# Patient Record
Sex: Male | Born: 1935
Health system: Southern US, Community
[De-identification: ages and names within clinical notes are randomized; demographics above are authoritative.]

## PROBLEM LIST (undated history)

## (undated) DIAGNOSIS — T7840XA Allergy, unspecified, initial encounter: Secondary | ICD-10-CM

## (undated) DIAGNOSIS — I509 Heart failure, unspecified: Secondary | ICD-10-CM

## (undated) DIAGNOSIS — E119 Type 2 diabetes mellitus without complications: Secondary | ICD-10-CM

## (undated) HISTORY — DX: Allergy, unspecified, initial encounter: T78.40XA

## (undated) HISTORY — DX: Heart failure, unspecified: I50.9

## (undated) HISTORY — DX: Type 2 diabetes mellitus without complications: E11.9

---

## 2013-04-17 DIAGNOSIS — E119 Type 2 diabetes mellitus without complications: Secondary | ICD-10-CM | POA: Diagnosis not present

## 2013-04-17 DIAGNOSIS — I10 Essential (primary) hypertension: Secondary | ICD-10-CM | POA: Diagnosis not present

## 2013-04-17 DIAGNOSIS — N429 Disorder of prostate, unspecified: Secondary | ICD-10-CM | POA: Diagnosis not present

## 2013-04-17 DIAGNOSIS — E559 Vitamin D deficiency, unspecified: Secondary | ICD-10-CM | POA: Diagnosis not present

## 2013-04-17 DIAGNOSIS — I25812 Atherosclerosis of bypass graft of coronary artery of transplanted heart without angina pectoris: Secondary | ICD-10-CM | POA: Diagnosis not present

## 2013-04-17 DIAGNOSIS — E782 Mixed hyperlipidemia: Secondary | ICD-10-CM | POA: Diagnosis not present

## 2013-04-17 DIAGNOSIS — D518 Other vitamin B12 deficiency anemias: Secondary | ICD-10-CM | POA: Diagnosis not present

## 2013-04-28 DIAGNOSIS — K5900 Constipation, unspecified: Secondary | ICD-10-CM | POA: Diagnosis not present

## 2013-05-19 DIAGNOSIS — E039 Hypothyroidism, unspecified: Secondary | ICD-10-CM | POA: Diagnosis not present

## 2013-05-19 DIAGNOSIS — E119 Type 2 diabetes mellitus without complications: Secondary | ICD-10-CM | POA: Diagnosis not present

## 2013-07-17 DIAGNOSIS — I25812 Atherosclerosis of bypass graft of coronary artery of transplanted heart without angina pectoris: Secondary | ICD-10-CM | POA: Diagnosis not present

## 2013-07-17 DIAGNOSIS — E119 Type 2 diabetes mellitus without complications: Secondary | ICD-10-CM | POA: Diagnosis not present

## 2013-07-17 DIAGNOSIS — I10 Essential (primary) hypertension: Secondary | ICD-10-CM | POA: Diagnosis not present

## 2013-07-17 DIAGNOSIS — E782 Mixed hyperlipidemia: Secondary | ICD-10-CM | POA: Diagnosis not present

## 2013-07-20 DIAGNOSIS — R059 Cough, unspecified: Secondary | ICD-10-CM | POA: Diagnosis not present

## 2013-07-20 DIAGNOSIS — R05 Cough: Secondary | ICD-10-CM | POA: Diagnosis not present

## 2013-07-27 DIAGNOSIS — H35379 Puckering of macula, unspecified eye: Secondary | ICD-10-CM | POA: Diagnosis not present

## 2013-07-28 DIAGNOSIS — R799 Abnormal finding of blood chemistry, unspecified: Secondary | ICD-10-CM | POA: Diagnosis not present

## 2013-07-28 DIAGNOSIS — R6889 Other general symptoms and signs: Secondary | ICD-10-CM | POA: Diagnosis not present

## 2013-07-29 DIAGNOSIS — I2510 Atherosclerotic heart disease of native coronary artery without angina pectoris: Secondary | ICD-10-CM | POA: Diagnosis not present

## 2013-07-29 DIAGNOSIS — R9431 Abnormal electrocardiogram [ECG] [EKG]: Secondary | ICD-10-CM | POA: Diagnosis not present

## 2013-07-29 DIAGNOSIS — I10 Essential (primary) hypertension: Secondary | ICD-10-CM | POA: Diagnosis not present

## 2013-07-29 DIAGNOSIS — E782 Mixed hyperlipidemia: Secondary | ICD-10-CM | POA: Diagnosis not present

## 2013-08-17 DIAGNOSIS — E039 Hypothyroidism, unspecified: Secondary | ICD-10-CM | POA: Diagnosis not present

## 2013-08-17 DIAGNOSIS — E119 Type 2 diabetes mellitus without complications: Secondary | ICD-10-CM | POA: Diagnosis not present

## 2013-08-17 DIAGNOSIS — R030 Elevated blood-pressure reading, without diagnosis of hypertension: Secondary | ICD-10-CM | POA: Diagnosis not present

## 2013-08-20 DIAGNOSIS — H259 Unspecified age-related cataract: Secondary | ICD-10-CM | POA: Diagnosis not present

## 2013-08-20 DIAGNOSIS — H43819 Vitreous degeneration, unspecified eye: Secondary | ICD-10-CM | POA: Diagnosis not present

## 2013-08-20 DIAGNOSIS — H35379 Puckering of macula, unspecified eye: Secondary | ICD-10-CM | POA: Diagnosis not present

## 2013-09-03 DIAGNOSIS — H2510 Age-related nuclear cataract, unspecified eye: Secondary | ICD-10-CM | POA: Diagnosis not present

## 2013-09-22 DIAGNOSIS — E039 Hypothyroidism, unspecified: Secondary | ICD-10-CM | POA: Diagnosis not present

## 2013-09-22 DIAGNOSIS — I25812 Atherosclerosis of bypass graft of coronary artery of transplanted heart without angina pectoris: Secondary | ICD-10-CM | POA: Diagnosis not present

## 2013-09-22 DIAGNOSIS — E782 Mixed hyperlipidemia: Secondary | ICD-10-CM | POA: Diagnosis not present

## 2013-10-05 DIAGNOSIS — Z01818 Encounter for other preprocedural examination: Secondary | ICD-10-CM | POA: Diagnosis not present

## 2013-10-05 DIAGNOSIS — H269 Unspecified cataract: Secondary | ICD-10-CM | POA: Diagnosis not present

## 2013-10-05 DIAGNOSIS — H2510 Age-related nuclear cataract, unspecified eye: Secondary | ICD-10-CM | POA: Diagnosis not present

## 2013-10-05 DIAGNOSIS — I2510 Atherosclerotic heart disease of native coronary artery without angina pectoris: Secondary | ICD-10-CM | POA: Diagnosis not present

## 2013-10-07 DIAGNOSIS — H35349 Macular cyst, hole, or pseudohole, unspecified eye: Secondary | ICD-10-CM | POA: Diagnosis not present

## 2013-10-07 DIAGNOSIS — H2510 Age-related nuclear cataract, unspecified eye: Secondary | ICD-10-CM | POA: Diagnosis not present

## 2013-10-07 DIAGNOSIS — Z9861 Coronary angioplasty status: Secondary | ICD-10-CM | POA: Diagnosis not present

## 2013-10-07 DIAGNOSIS — I2510 Atherosclerotic heart disease of native coronary artery without angina pectoris: Secondary | ICD-10-CM | POA: Diagnosis not present

## 2013-10-07 DIAGNOSIS — E119 Type 2 diabetes mellitus without complications: Secondary | ICD-10-CM | POA: Diagnosis not present

## 2013-10-07 DIAGNOSIS — H269 Unspecified cataract: Secondary | ICD-10-CM | POA: Diagnosis not present

## 2013-10-07 DIAGNOSIS — I252 Old myocardial infarction: Secondary | ICD-10-CM | POA: Diagnosis not present

## 2013-10-07 DIAGNOSIS — E7800 Pure hypercholesterolemia, unspecified: Secondary | ICD-10-CM | POA: Diagnosis not present

## 2013-10-07 DIAGNOSIS — E039 Hypothyroidism, unspecified: Secondary | ICD-10-CM | POA: Diagnosis not present

## 2013-10-07 DIAGNOSIS — I739 Peripheral vascular disease, unspecified: Secondary | ICD-10-CM | POA: Diagnosis not present

## 2013-10-07 DIAGNOSIS — Z7982 Long term (current) use of aspirin: Secondary | ICD-10-CM | POA: Diagnosis not present

## 2013-10-07 DIAGNOSIS — H2589 Other age-related cataract: Secondary | ICD-10-CM | POA: Diagnosis not present

## 2013-10-07 DIAGNOSIS — Z951 Presence of aortocoronary bypass graft: Secondary | ICD-10-CM | POA: Diagnosis not present

## 2013-10-07 DIAGNOSIS — Z87891 Personal history of nicotine dependence: Secondary | ICD-10-CM | POA: Diagnosis not present

## 2014-02-08 DIAGNOSIS — H35372 Puckering of macula, left eye: Secondary | ICD-10-CM | POA: Diagnosis not present

## 2014-02-08 DIAGNOSIS — E782 Mixed hyperlipidemia: Secondary | ICD-10-CM | POA: Diagnosis not present

## 2014-02-08 DIAGNOSIS — E119 Type 2 diabetes mellitus without complications: Secondary | ICD-10-CM | POA: Diagnosis not present

## 2014-02-08 DIAGNOSIS — I2510 Atherosclerotic heart disease of native coronary artery without angina pectoris: Secondary | ICD-10-CM | POA: Diagnosis not present

## 2014-02-08 DIAGNOSIS — N400 Enlarged prostate without lower urinary tract symptoms: Secondary | ICD-10-CM | POA: Diagnosis not present

## 2014-02-08 DIAGNOSIS — I10 Essential (primary) hypertension: Secondary | ICD-10-CM | POA: Diagnosis not present

## 2014-02-08 DIAGNOSIS — Z23 Encounter for immunization: Secondary | ICD-10-CM | POA: Diagnosis not present

## 2014-02-08 DIAGNOSIS — E559 Vitamin D deficiency, unspecified: Secondary | ICD-10-CM | POA: Diagnosis not present

## 2014-04-26 DIAGNOSIS — I10 Essential (primary) hypertension: Secondary | ICD-10-CM | POA: Diagnosis not present

## 2014-04-26 DIAGNOSIS — E878 Other disorders of electrolyte and fluid balance, not elsewhere classified: Secondary | ICD-10-CM | POA: Diagnosis not present

## 2014-04-26 DIAGNOSIS — E119 Type 2 diabetes mellitus without complications: Secondary | ICD-10-CM | POA: Diagnosis not present

## 2014-04-26 DIAGNOSIS — E780 Pure hypercholesterolemia: Secondary | ICD-10-CM | POA: Diagnosis not present

## 2014-05-06 DIAGNOSIS — E780 Pure hypercholesterolemia: Secondary | ICD-10-CM | POA: Diagnosis not present

## 2014-05-06 DIAGNOSIS — I10 Essential (primary) hypertension: Secondary | ICD-10-CM | POA: Diagnosis not present

## 2014-05-06 DIAGNOSIS — I252 Old myocardial infarction: Secondary | ICD-10-CM | POA: Diagnosis not present

## 2014-05-06 DIAGNOSIS — I2510 Atherosclerotic heart disease of native coronary artery without angina pectoris: Secondary | ICD-10-CM | POA: Diagnosis not present

## 2014-07-19 DIAGNOSIS — E1165 Type 2 diabetes mellitus with hyperglycemia: Secondary | ICD-10-CM | POA: Diagnosis not present

## 2014-07-26 DIAGNOSIS — E119 Type 2 diabetes mellitus without complications: Secondary | ICD-10-CM | POA: Diagnosis not present

## 2014-07-26 DIAGNOSIS — E780 Pure hypercholesterolemia: Secondary | ICD-10-CM | POA: Diagnosis not present

## 2014-07-26 DIAGNOSIS — Z955 Presence of coronary angioplasty implant and graft: Secondary | ICD-10-CM | POA: Diagnosis not present

## 2014-08-10 DIAGNOSIS — I2510 Atherosclerotic heart disease of native coronary artery without angina pectoris: Secondary | ICD-10-CM | POA: Diagnosis not present

## 2014-08-10 DIAGNOSIS — E785 Hyperlipidemia, unspecified: Secondary | ICD-10-CM | POA: Diagnosis not present

## 2014-08-10 DIAGNOSIS — I252 Old myocardial infarction: Secondary | ICD-10-CM | POA: Diagnosis not present

## 2014-08-25 DIAGNOSIS — I252 Old myocardial infarction: Secondary | ICD-10-CM | POA: Diagnosis not present

## 2014-08-25 DIAGNOSIS — R0789 Other chest pain: Secondary | ICD-10-CM | POA: Diagnosis not present

## 2014-08-30 DIAGNOSIS — I10 Essential (primary) hypertension: Secondary | ICD-10-CM | POA: Insufficient documentation

## 2014-08-30 DIAGNOSIS — I255 Ischemic cardiomyopathy: Secondary | ICD-10-CM | POA: Diagnosis not present

## 2014-08-30 DIAGNOSIS — I2510 Atherosclerotic heart disease of native coronary artery without angina pectoris: Secondary | ICD-10-CM | POA: Diagnosis not present

## 2014-08-30 DIAGNOSIS — E119 Type 2 diabetes mellitus without complications: Secondary | ICD-10-CM | POA: Insufficient documentation

## 2014-09-20 DIAGNOSIS — I255 Ischemic cardiomyopathy: Secondary | ICD-10-CM | POA: Diagnosis not present

## 2014-09-20 DIAGNOSIS — Z131 Encounter for screening for diabetes mellitus: Secondary | ICD-10-CM | POA: Diagnosis not present

## 2014-09-20 DIAGNOSIS — I10 Essential (primary) hypertension: Secondary | ICD-10-CM | POA: Diagnosis not present

## 2014-09-30 DIAGNOSIS — N63 Unspecified lump in breast: Secondary | ICD-10-CM | POA: Diagnosis not present

## 2014-09-30 DIAGNOSIS — R918 Other nonspecific abnormal finding of lung field: Secondary | ICD-10-CM | POA: Diagnosis not present

## 2014-09-30 DIAGNOSIS — M47814 Spondylosis without myelopathy or radiculopathy, thoracic region: Secondary | ICD-10-CM | POA: Diagnosis not present

## 2014-09-30 DIAGNOSIS — Z01810 Encounter for preprocedural cardiovascular examination: Secondary | ICD-10-CM | POA: Diagnosis not present

## 2014-09-30 DIAGNOSIS — Z01818 Encounter for other preprocedural examination: Secondary | ICD-10-CM | POA: Diagnosis not present

## 2014-09-30 DIAGNOSIS — Z131 Encounter for screening for diabetes mellitus: Secondary | ICD-10-CM | POA: Diagnosis not present

## 2014-09-30 DIAGNOSIS — I10 Essential (primary) hypertension: Secondary | ICD-10-CM | POA: Diagnosis not present

## 2014-09-30 DIAGNOSIS — I255 Ischemic cardiomyopathy: Secondary | ICD-10-CM | POA: Diagnosis not present

## 2014-10-07 DIAGNOSIS — Z4502 Encounter for adjustment and management of automatic implantable cardiac defibrillator: Secondary | ICD-10-CM | POA: Diagnosis not present

## 2014-10-07 DIAGNOSIS — M47814 Spondylosis without myelopathy or radiculopathy, thoracic region: Secondary | ICD-10-CM | POA: Diagnosis not present

## 2014-10-07 DIAGNOSIS — I517 Cardiomegaly: Secondary | ICD-10-CM | POA: Diagnosis not present

## 2014-10-07 DIAGNOSIS — I10 Essential (primary) hypertension: Secondary | ICD-10-CM | POA: Diagnosis not present

## 2014-10-07 DIAGNOSIS — Z79899 Other long term (current) drug therapy: Secondary | ICD-10-CM | POA: Diagnosis not present

## 2014-10-07 DIAGNOSIS — E119 Type 2 diabetes mellitus without complications: Secondary | ICD-10-CM | POA: Diagnosis not present

## 2014-10-07 DIAGNOSIS — I255 Ischemic cardiomyopathy: Secondary | ICD-10-CM | POA: Diagnosis not present

## 2014-10-08 DIAGNOSIS — M47814 Spondylosis without myelopathy or radiculopathy, thoracic region: Secondary | ICD-10-CM | POA: Diagnosis not present

## 2014-10-08 DIAGNOSIS — Z79899 Other long term (current) drug therapy: Secondary | ICD-10-CM | POA: Diagnosis not present

## 2014-10-08 DIAGNOSIS — Z45018 Encounter for adjustment and management of other part of cardiac pacemaker: Secondary | ICD-10-CM | POA: Diagnosis not present

## 2014-10-08 DIAGNOSIS — I10 Essential (primary) hypertension: Secondary | ICD-10-CM | POA: Diagnosis not present

## 2014-10-08 DIAGNOSIS — E119 Type 2 diabetes mellitus without complications: Secondary | ICD-10-CM | POA: Diagnosis not present

## 2014-10-08 DIAGNOSIS — I255 Ischemic cardiomyopathy: Secondary | ICD-10-CM | POA: Diagnosis not present

## 2014-10-18 DIAGNOSIS — I255 Ischemic cardiomyopathy: Secondary | ICD-10-CM | POA: Diagnosis not present

## 2014-10-18 DIAGNOSIS — Z4502 Encounter for adjustment and management of automatic implantable cardiac defibrillator: Secondary | ICD-10-CM | POA: Diagnosis not present

## 2014-10-28 DIAGNOSIS — Z955 Presence of coronary angioplasty implant and graft: Secondary | ICD-10-CM | POA: Diagnosis not present

## 2014-10-28 DIAGNOSIS — I252 Old myocardial infarction: Secondary | ICD-10-CM | POA: Diagnosis not present

## 2014-10-28 DIAGNOSIS — I2510 Atherosclerotic heart disease of native coronary artery without angina pectoris: Secondary | ICD-10-CM | POA: Diagnosis not present

## 2014-10-28 DIAGNOSIS — E785 Hyperlipidemia, unspecified: Secondary | ICD-10-CM | POA: Diagnosis not present

## 2014-12-28 DIAGNOSIS — H2513 Age-related nuclear cataract, bilateral: Secondary | ICD-10-CM | POA: Diagnosis not present

## 2014-12-29 DIAGNOSIS — R600 Localized edema: Secondary | ICD-10-CM | POA: Diagnosis not present

## 2014-12-29 DIAGNOSIS — I2510 Atherosclerotic heart disease of native coronary artery without angina pectoris: Secondary | ICD-10-CM | POA: Diagnosis not present

## 2014-12-29 DIAGNOSIS — I10 Essential (primary) hypertension: Secondary | ICD-10-CM | POA: Diagnosis not present

## 2014-12-29 DIAGNOSIS — E119 Type 2 diabetes mellitus without complications: Secondary | ICD-10-CM | POA: Diagnosis not present

## 2014-12-29 DIAGNOSIS — Z131 Encounter for screening for diabetes mellitus: Secondary | ICD-10-CM | POA: Diagnosis not present

## 2014-12-31 DIAGNOSIS — I2510 Atherosclerotic heart disease of native coronary artery without angina pectoris: Secondary | ICD-10-CM | POA: Diagnosis not present

## 2015-01-03 DIAGNOSIS — H2511 Age-related nuclear cataract, right eye: Secondary | ICD-10-CM | POA: Diagnosis not present

## 2015-01-05 DIAGNOSIS — Z79899 Other long term (current) drug therapy: Secondary | ICD-10-CM | POA: Diagnosis not present

## 2015-01-05 DIAGNOSIS — E039 Hypothyroidism, unspecified: Secondary | ICD-10-CM | POA: Diagnosis not present

## 2015-01-05 DIAGNOSIS — Z23 Encounter for immunization: Secondary | ICD-10-CM | POA: Diagnosis not present

## 2015-01-05 DIAGNOSIS — E119 Type 2 diabetes mellitus without complications: Secondary | ICD-10-CM | POA: Diagnosis not present

## 2015-01-11 DIAGNOSIS — I5022 Chronic systolic (congestive) heart failure: Secondary | ICD-10-CM | POA: Diagnosis not present

## 2015-01-11 DIAGNOSIS — I2510 Atherosclerotic heart disease of native coronary artery without angina pectoris: Secondary | ICD-10-CM | POA: Diagnosis not present

## 2015-01-11 DIAGNOSIS — I10 Essential (primary) hypertension: Secondary | ICD-10-CM | POA: Diagnosis not present

## 2015-01-11 DIAGNOSIS — I255 Ischemic cardiomyopathy: Secondary | ICD-10-CM | POA: Diagnosis not present

## 2015-03-14 DIAGNOSIS — Z7984 Long term (current) use of oral hypoglycemic drugs: Secondary | ICD-10-CM | POA: Diagnosis not present

## 2015-03-14 DIAGNOSIS — R079 Chest pain, unspecified: Secondary | ICD-10-CM | POA: Diagnosis not present

## 2015-03-14 DIAGNOSIS — I2510 Atherosclerotic heart disease of native coronary artery without angina pectoris: Secondary | ICD-10-CM | POA: Diagnosis present

## 2015-03-14 DIAGNOSIS — Z7982 Long term (current) use of aspirin: Secondary | ICD-10-CM | POA: Diagnosis not present

## 2015-03-14 DIAGNOSIS — I5023 Acute on chronic systolic (congestive) heart failure: Secondary | ICD-10-CM | POA: Diagnosis present

## 2015-03-14 DIAGNOSIS — Z79899 Other long term (current) drug therapy: Secondary | ICD-10-CM | POA: Diagnosis not present

## 2015-03-14 DIAGNOSIS — E039 Hypothyroidism, unspecified: Secondary | ICD-10-CM | POA: Insufficient documentation

## 2015-03-14 DIAGNOSIS — Z955 Presence of coronary angioplasty implant and graft: Secondary | ICD-10-CM | POA: Diagnosis not present

## 2015-03-14 DIAGNOSIS — Z961 Presence of intraocular lens: Secondary | ICD-10-CM | POA: Diagnosis present

## 2015-03-14 DIAGNOSIS — I10 Essential (primary) hypertension: Secondary | ICD-10-CM | POA: Diagnosis not present

## 2015-03-14 DIAGNOSIS — E785 Hyperlipidemia, unspecified: Secondary | ICD-10-CM | POA: Diagnosis present

## 2015-03-14 DIAGNOSIS — I252 Old myocardial infarction: Secondary | ICD-10-CM | POA: Diagnosis not present

## 2015-03-14 DIAGNOSIS — I255 Ischemic cardiomyopathy: Secondary | ICD-10-CM | POA: Diagnosis present

## 2015-03-14 DIAGNOSIS — I110 Hypertensive heart disease with heart failure: Secondary | ICD-10-CM | POA: Diagnosis present

## 2015-03-14 DIAGNOSIS — I5021 Acute systolic (congestive) heart failure: Secondary | ICD-10-CM | POA: Diagnosis not present

## 2015-03-14 DIAGNOSIS — R0789 Other chest pain: Secondary | ICD-10-CM | POA: Insufficient documentation

## 2015-03-14 DIAGNOSIS — Z87891 Personal history of nicotine dependence: Secondary | ICD-10-CM | POA: Diagnosis not present

## 2015-03-14 DIAGNOSIS — Z9842 Cataract extraction status, left eye: Secondary | ICD-10-CM | POA: Diagnosis not present

## 2015-03-14 DIAGNOSIS — I501 Left ventricular failure: Secondary | ICD-10-CM | POA: Diagnosis not present

## 2015-03-14 DIAGNOSIS — E875 Hyperkalemia: Secondary | ICD-10-CM | POA: Diagnosis not present

## 2015-03-14 DIAGNOSIS — Z95810 Presence of automatic (implantable) cardiac defibrillator: Secondary | ICD-10-CM | POA: Diagnosis not present

## 2015-03-14 DIAGNOSIS — E11649 Type 2 diabetes mellitus with hypoglycemia without coma: Secondary | ICD-10-CM | POA: Diagnosis not present

## 2015-03-14 DIAGNOSIS — R0602 Shortness of breath: Secondary | ICD-10-CM | POA: Diagnosis not present

## 2015-03-14 DIAGNOSIS — E871 Hypo-osmolality and hyponatremia: Secondary | ICD-10-CM | POA: Diagnosis present

## 2015-03-14 DIAGNOSIS — I509 Heart failure, unspecified: Secondary | ICD-10-CM | POA: Diagnosis not present

## 2015-03-29 DIAGNOSIS — I255 Ischemic cardiomyopathy: Secondary | ICD-10-CM | POA: Diagnosis not present

## 2015-03-29 DIAGNOSIS — I5023 Acute on chronic systolic (congestive) heart failure: Secondary | ICD-10-CM | POA: Diagnosis not present

## 2015-03-29 DIAGNOSIS — I2510 Atherosclerotic heart disease of native coronary artery without angina pectoris: Secondary | ICD-10-CM | POA: Diagnosis not present

## 2015-03-29 DIAGNOSIS — R0609 Other forms of dyspnea: Secondary | ICD-10-CM | POA: Diagnosis not present

## 2015-04-15 DIAGNOSIS — I447 Left bundle-branch block, unspecified: Secondary | ICD-10-CM | POA: Diagnosis not present

## 2015-04-15 DIAGNOSIS — I10 Essential (primary) hypertension: Secondary | ICD-10-CM | POA: Diagnosis not present

## 2015-04-15 DIAGNOSIS — I255 Ischemic cardiomyopathy: Secondary | ICD-10-CM | POA: Diagnosis not present

## 2015-04-15 DIAGNOSIS — Z955 Presence of coronary angioplasty implant and graft: Secondary | ICD-10-CM | POA: Diagnosis not present

## 2015-04-15 DIAGNOSIS — I2510 Atherosclerotic heart disease of native coronary artery without angina pectoris: Secondary | ICD-10-CM | POA: Diagnosis not present

## 2015-04-15 DIAGNOSIS — I5022 Chronic systolic (congestive) heart failure: Secondary | ICD-10-CM | POA: Diagnosis not present

## 2015-04-21 DIAGNOSIS — I2510 Atherosclerotic heart disease of native coronary artery without angina pectoris: Secondary | ICD-10-CM | POA: Diagnosis not present

## 2015-04-21 DIAGNOSIS — I255 Ischemic cardiomyopathy: Secondary | ICD-10-CM | POA: Diagnosis not present

## 2015-04-22 DIAGNOSIS — H6123 Impacted cerumen, bilateral: Secondary | ICD-10-CM | POA: Diagnosis not present

## 2015-05-13 DIAGNOSIS — I5022 Chronic systolic (congestive) heart failure: Secondary | ICD-10-CM | POA: Diagnosis not present

## 2015-05-13 DIAGNOSIS — I2510 Atherosclerotic heart disease of native coronary artery without angina pectoris: Secondary | ICD-10-CM | POA: Diagnosis not present

## 2015-05-13 DIAGNOSIS — I10 Essential (primary) hypertension: Secondary | ICD-10-CM | POA: Diagnosis not present

## 2015-05-13 DIAGNOSIS — I255 Ischemic cardiomyopathy: Secondary | ICD-10-CM | POA: Diagnosis not present

## 2015-05-13 DIAGNOSIS — Z01818 Encounter for other preprocedural examination: Secondary | ICD-10-CM | POA: Diagnosis not present

## 2015-05-13 DIAGNOSIS — I447 Left bundle-branch block, unspecified: Secondary | ICD-10-CM | POA: Diagnosis not present

## 2015-05-13 DIAGNOSIS — Z955 Presence of coronary angioplasty implant and graft: Secondary | ICD-10-CM | POA: Diagnosis not present

## 2015-05-16 DIAGNOSIS — I420 Dilated cardiomyopathy: Secondary | ICD-10-CM | POA: Diagnosis not present

## 2015-05-16 DIAGNOSIS — Z950 Presence of cardiac pacemaker: Secondary | ICD-10-CM | POA: Diagnosis not present

## 2015-05-16 DIAGNOSIS — I501 Left ventricular failure: Secondary | ICD-10-CM | POA: Diagnosis not present

## 2015-05-16 DIAGNOSIS — I2510 Atherosclerotic heart disease of native coronary artery without angina pectoris: Secondary | ICD-10-CM | POA: Diagnosis not present

## 2015-05-16 DIAGNOSIS — Z79899 Other long term (current) drug therapy: Secondary | ICD-10-CM | POA: Diagnosis not present

## 2015-05-16 DIAGNOSIS — I10 Essential (primary) hypertension: Secondary | ICD-10-CM | POA: Diagnosis not present

## 2015-05-16 DIAGNOSIS — I447 Left bundle-branch block, unspecified: Secondary | ICD-10-CM | POA: Diagnosis not present

## 2015-05-16 DIAGNOSIS — I255 Ischemic cardiomyopathy: Secondary | ICD-10-CM | POA: Diagnosis not present

## 2015-05-16 DIAGNOSIS — I5022 Chronic systolic (congestive) heart failure: Secondary | ICD-10-CM | POA: Diagnosis not present

## 2015-05-17 DIAGNOSIS — I255 Ischemic cardiomyopathy: Secondary | ICD-10-CM | POA: Diagnosis not present

## 2015-05-17 DIAGNOSIS — Z95810 Presence of automatic (implantable) cardiac defibrillator: Secondary | ICD-10-CM | POA: Diagnosis not present

## 2015-05-17 DIAGNOSIS — I420 Dilated cardiomyopathy: Secondary | ICD-10-CM | POA: Diagnosis not present

## 2015-05-17 DIAGNOSIS — I501 Left ventricular failure: Secondary | ICD-10-CM | POA: Diagnosis not present

## 2015-05-17 DIAGNOSIS — I10 Essential (primary) hypertension: Secondary | ICD-10-CM | POA: Diagnosis not present

## 2015-05-17 DIAGNOSIS — I509 Heart failure, unspecified: Secondary | ICD-10-CM | POA: Diagnosis not present

## 2015-05-17 DIAGNOSIS — I5022 Chronic systolic (congestive) heart failure: Secondary | ICD-10-CM | POA: Diagnosis not present

## 2015-05-17 DIAGNOSIS — I2510 Atherosclerotic heart disease of native coronary artery without angina pectoris: Secondary | ICD-10-CM | POA: Diagnosis not present

## 2015-05-17 DIAGNOSIS — E119 Type 2 diabetes mellitus without complications: Secondary | ICD-10-CM | POA: Diagnosis not present

## 2015-05-17 DIAGNOSIS — I447 Left bundle-branch block, unspecified: Secondary | ICD-10-CM | POA: Diagnosis not present

## 2015-05-27 DIAGNOSIS — Z4502 Encounter for adjustment and management of automatic implantable cardiac defibrillator: Secondary | ICD-10-CM | POA: Diagnosis not present

## 2015-05-27 DIAGNOSIS — I255 Ischemic cardiomyopathy: Secondary | ICD-10-CM | POA: Diagnosis not present

## 2015-07-07 DIAGNOSIS — E039 Hypothyroidism, unspecified: Secondary | ICD-10-CM | POA: Diagnosis not present

## 2015-07-07 DIAGNOSIS — E119 Type 2 diabetes mellitus without complications: Secondary | ICD-10-CM | POA: Diagnosis not present

## 2015-07-14 DIAGNOSIS — E119 Type 2 diabetes mellitus without complications: Secondary | ICD-10-CM | POA: Diagnosis not present

## 2015-07-14 DIAGNOSIS — Z1389 Encounter for screening for other disorder: Secondary | ICD-10-CM | POA: Diagnosis not present

## 2015-07-14 DIAGNOSIS — Z9181 History of falling: Secondary | ICD-10-CM | POA: Diagnosis not present

## 2015-09-26 DIAGNOSIS — Z95810 Presence of automatic (implantable) cardiac defibrillator: Secondary | ICD-10-CM | POA: Diagnosis not present

## 2015-09-26 DIAGNOSIS — Z4502 Encounter for adjustment and management of automatic implantable cardiac defibrillator: Secondary | ICD-10-CM | POA: Diagnosis not present

## 2015-09-26 DIAGNOSIS — I255 Ischemic cardiomyopathy: Secondary | ICD-10-CM | POA: Diagnosis not present

## 2015-09-26 DIAGNOSIS — I5022 Chronic systolic (congestive) heart failure: Secondary | ICD-10-CM | POA: Diagnosis not present

## 2015-12-28 DIAGNOSIS — Z95810 Presence of automatic (implantable) cardiac defibrillator: Secondary | ICD-10-CM | POA: Diagnosis not present

## 2015-12-28 DIAGNOSIS — I5023 Acute on chronic systolic (congestive) heart failure: Secondary | ICD-10-CM | POA: Diagnosis not present

## 2016-01-24 DIAGNOSIS — H26492 Other secondary cataract, left eye: Secondary | ICD-10-CM | POA: Diagnosis not present

## 2016-01-24 DIAGNOSIS — H35372 Puckering of macula, left eye: Secondary | ICD-10-CM | POA: Diagnosis not present

## 2016-01-24 DIAGNOSIS — E119 Type 2 diabetes mellitus without complications: Secondary | ICD-10-CM | POA: Diagnosis not present

## 2016-01-25 DIAGNOSIS — Z23 Encounter for immunization: Secondary | ICD-10-CM | POA: Diagnosis not present

## 2016-03-28 DIAGNOSIS — I255 Ischemic cardiomyopathy: Secondary | ICD-10-CM | POA: Diagnosis not present

## 2016-03-28 DIAGNOSIS — I420 Dilated cardiomyopathy: Secondary | ICD-10-CM | POA: Diagnosis not present

## 2016-05-09 DIAGNOSIS — H26492 Other secondary cataract, left eye: Secondary | ICD-10-CM | POA: Diagnosis not present

## 2016-05-09 DIAGNOSIS — H35372 Puckering of macula, left eye: Secondary | ICD-10-CM | POA: Diagnosis not present

## 2016-06-27 DIAGNOSIS — I5023 Acute on chronic systolic (congestive) heart failure: Secondary | ICD-10-CM | POA: Diagnosis not present

## 2016-06-27 DIAGNOSIS — Z95810 Presence of automatic (implantable) cardiac defibrillator: Secondary | ICD-10-CM | POA: Diagnosis not present

## 2016-07-20 DIAGNOSIS — I255 Ischemic cardiomyopathy: Secondary | ICD-10-CM | POA: Diagnosis not present

## 2016-07-20 DIAGNOSIS — Z95810 Presence of automatic (implantable) cardiac defibrillator: Secondary | ICD-10-CM | POA: Diagnosis not present

## 2016-07-20 DIAGNOSIS — R079 Chest pain, unspecified: Secondary | ICD-10-CM | POA: Diagnosis not present

## 2016-07-20 DIAGNOSIS — I2510 Atherosclerotic heart disease of native coronary artery without angina pectoris: Secondary | ICD-10-CM | POA: Diagnosis not present

## 2016-07-20 DIAGNOSIS — I420 Dilated cardiomyopathy: Secondary | ICD-10-CM | POA: Diagnosis not present

## 2016-08-07 DIAGNOSIS — I255 Ischemic cardiomyopathy: Secondary | ICD-10-CM | POA: Diagnosis not present

## 2016-08-07 DIAGNOSIS — I420 Dilated cardiomyopathy: Secondary | ICD-10-CM | POA: Diagnosis not present

## 2016-08-07 DIAGNOSIS — R0602 Shortness of breath: Secondary | ICD-10-CM | POA: Diagnosis not present

## 2016-08-07 DIAGNOSIS — I2510 Atherosclerotic heart disease of native coronary artery without angina pectoris: Secondary | ICD-10-CM | POA: Diagnosis not present

## 2016-08-07 DIAGNOSIS — Z9861 Coronary angioplasty status: Secondary | ICD-10-CM | POA: Diagnosis not present

## 2016-08-07 DIAGNOSIS — Z950 Presence of cardiac pacemaker: Secondary | ICD-10-CM | POA: Diagnosis not present

## 2016-08-07 DIAGNOSIS — R079 Chest pain, unspecified: Secondary | ICD-10-CM | POA: Diagnosis not present

## 2016-08-15 DIAGNOSIS — I5022 Chronic systolic (congestive) heart failure: Secondary | ICD-10-CM | POA: Diagnosis not present

## 2016-08-15 DIAGNOSIS — I420 Dilated cardiomyopathy: Secondary | ICD-10-CM | POA: Diagnosis not present

## 2016-08-15 DIAGNOSIS — R0789 Other chest pain: Secondary | ICD-10-CM | POA: Diagnosis not present

## 2016-08-15 DIAGNOSIS — I255 Ischemic cardiomyopathy: Secondary | ICD-10-CM | POA: Diagnosis not present

## 2016-08-15 DIAGNOSIS — I10 Essential (primary) hypertension: Secondary | ICD-10-CM | POA: Diagnosis not present

## 2016-08-15 DIAGNOSIS — I2510 Atherosclerotic heart disease of native coronary artery without angina pectoris: Secondary | ICD-10-CM | POA: Diagnosis not present

## 2016-09-27 DIAGNOSIS — Z95810 Presence of automatic (implantable) cardiac defibrillator: Secondary | ICD-10-CM | POA: Diagnosis not present

## 2016-10-31 DIAGNOSIS — I10 Essential (primary) hypertension: Secondary | ICD-10-CM | POA: Diagnosis not present

## 2016-10-31 DIAGNOSIS — Z79899 Other long term (current) drug therapy: Secondary | ICD-10-CM | POA: Diagnosis not present

## 2016-10-31 DIAGNOSIS — Z0000 Encounter for general adult medical examination without abnormal findings: Secondary | ICD-10-CM | POA: Diagnosis not present

## 2016-10-31 DIAGNOSIS — E789 Disorder of lipoprotein metabolism, unspecified: Secondary | ICD-10-CM | POA: Diagnosis not present

## 2016-10-31 DIAGNOSIS — E039 Hypothyroidism, unspecified: Secondary | ICD-10-CM | POA: Diagnosis not present

## 2016-10-31 DIAGNOSIS — E119 Type 2 diabetes mellitus without complications: Secondary | ICD-10-CM | POA: Diagnosis not present

## 2016-12-26 DIAGNOSIS — Z23 Encounter for immunization: Secondary | ICD-10-CM | POA: Diagnosis not present

## 2016-12-28 DIAGNOSIS — Z95810 Presence of automatic (implantable) cardiac defibrillator: Secondary | ICD-10-CM | POA: Diagnosis not present

## 2016-12-28 DIAGNOSIS — I255 Ischemic cardiomyopathy: Secondary | ICD-10-CM | POA: Diagnosis not present

## 2017-01-09 DIAGNOSIS — H35342 Macular cyst, hole, or pseudohole, left eye: Secondary | ICD-10-CM | POA: Diagnosis not present

## 2017-01-29 DIAGNOSIS — E785 Hyperlipidemia, unspecified: Secondary | ICD-10-CM | POA: Insufficient documentation

## 2017-01-29 DIAGNOSIS — I5022 Chronic systolic (congestive) heart failure: Secondary | ICD-10-CM | POA: Diagnosis not present

## 2017-01-29 DIAGNOSIS — I252 Old myocardial infarction: Secondary | ICD-10-CM | POA: Diagnosis not present

## 2017-01-29 DIAGNOSIS — I2510 Atherosclerotic heart disease of native coronary artery without angina pectoris: Secondary | ICD-10-CM | POA: Insufficient documentation

## 2017-01-29 DIAGNOSIS — I420 Dilated cardiomyopathy: Secondary | ICD-10-CM | POA: Diagnosis not present

## 2017-01-29 DIAGNOSIS — I25810 Atherosclerosis of coronary artery bypass graft(s) without angina pectoris: Secondary | ICD-10-CM | POA: Diagnosis not present

## 2017-01-29 DIAGNOSIS — I110 Hypertensive heart disease with heart failure: Secondary | ICD-10-CM | POA: Diagnosis not present

## 2017-01-29 DIAGNOSIS — E119 Type 2 diabetes mellitus without complications: Secondary | ICD-10-CM | POA: Diagnosis not present

## 2017-01-29 DIAGNOSIS — I255 Ischemic cardiomyopathy: Secondary | ICD-10-CM | POA: Diagnosis not present

## 2017-02-08 DIAGNOSIS — I5022 Chronic systolic (congestive) heart failure: Secondary | ICD-10-CM | POA: Diagnosis not present

## 2017-04-15 DIAGNOSIS — Z95810 Presence of automatic (implantable) cardiac defibrillator: Secondary | ICD-10-CM | POA: Diagnosis not present

## 2017-04-15 DIAGNOSIS — Z87891 Personal history of nicotine dependence: Secondary | ICD-10-CM | POA: Diagnosis not present

## 2017-04-15 DIAGNOSIS — Z951 Presence of aortocoronary bypass graft: Secondary | ICD-10-CM | POA: Diagnosis not present

## 2017-04-15 DIAGNOSIS — Z4502 Encounter for adjustment and management of automatic implantable cardiac defibrillator: Secondary | ICD-10-CM | POA: Diagnosis not present

## 2017-04-15 DIAGNOSIS — I5022 Chronic systolic (congestive) heart failure: Secondary | ICD-10-CM | POA: Diagnosis not present

## 2017-04-15 DIAGNOSIS — I252 Old myocardial infarction: Secondary | ICD-10-CM | POA: Diagnosis not present

## 2017-04-15 DIAGNOSIS — I2510 Atherosclerotic heart disease of native coronary artery without angina pectoris: Secondary | ICD-10-CM | POA: Diagnosis not present

## 2017-04-17 DIAGNOSIS — H35372 Puckering of macula, left eye: Secondary | ICD-10-CM | POA: Diagnosis not present

## 2017-04-17 DIAGNOSIS — H35342 Macular cyst, hole, or pseudohole, left eye: Secondary | ICD-10-CM | POA: Diagnosis not present

## 2017-05-01 DIAGNOSIS — H35372 Puckering of macula, left eye: Secondary | ICD-10-CM | POA: Diagnosis not present

## 2017-05-01 DIAGNOSIS — H5789 Other specified disorders of eye and adnexa: Secondary | ICD-10-CM | POA: Diagnosis not present

## 2017-05-01 DIAGNOSIS — H35342 Macular cyst, hole, or pseudohole, left eye: Secondary | ICD-10-CM | POA: Diagnosis not present

## 2017-05-10 DIAGNOSIS — I25810 Atherosclerosis of coronary artery bypass graft(s) without angina pectoris: Secondary | ICD-10-CM | POA: Diagnosis not present

## 2017-05-10 DIAGNOSIS — I252 Old myocardial infarction: Secondary | ICD-10-CM | POA: Diagnosis not present

## 2017-05-10 DIAGNOSIS — I5022 Chronic systolic (congestive) heart failure: Secondary | ICD-10-CM | POA: Diagnosis not present

## 2017-05-21 DIAGNOSIS — H35342 Macular cyst, hole, or pseudohole, left eye: Secondary | ICD-10-CM | POA: Diagnosis not present

## 2017-05-21 DIAGNOSIS — H43813 Vitreous degeneration, bilateral: Secondary | ICD-10-CM | POA: Diagnosis not present

## 2017-06-03 DIAGNOSIS — E785 Hyperlipidemia, unspecified: Secondary | ICD-10-CM | POA: Diagnosis not present

## 2017-06-03 DIAGNOSIS — I25118 Atherosclerotic heart disease of native coronary artery with other forms of angina pectoris: Secondary | ICD-10-CM | POA: Diagnosis not present

## 2017-06-03 DIAGNOSIS — I420 Dilated cardiomyopathy: Secondary | ICD-10-CM | POA: Diagnosis not present

## 2017-06-03 DIAGNOSIS — I252 Old myocardial infarction: Secondary | ICD-10-CM | POA: Diagnosis not present

## 2017-06-03 DIAGNOSIS — I255 Ischemic cardiomyopathy: Secondary | ICD-10-CM | POA: Diagnosis not present

## 2017-06-03 DIAGNOSIS — Z95810 Presence of automatic (implantable) cardiac defibrillator: Secondary | ICD-10-CM | POA: Diagnosis not present

## 2017-06-03 DIAGNOSIS — I5022 Chronic systolic (congestive) heart failure: Secondary | ICD-10-CM | POA: Diagnosis not present

## 2017-06-03 DIAGNOSIS — E119 Type 2 diabetes mellitus without complications: Secondary | ICD-10-CM | POA: Diagnosis not present

## 2017-06-03 DIAGNOSIS — I110 Hypertensive heart disease with heart failure: Secondary | ICD-10-CM | POA: Diagnosis not present

## 2017-06-06 DIAGNOSIS — Z6824 Body mass index (BMI) 24.0-24.9, adult: Secondary | ICD-10-CM | POA: Diagnosis not present

## 2017-06-06 DIAGNOSIS — Z79899 Other long term (current) drug therapy: Secondary | ICD-10-CM | POA: Diagnosis not present

## 2017-06-06 DIAGNOSIS — E119 Type 2 diabetes mellitus without complications: Secondary | ICD-10-CM | POA: Diagnosis not present

## 2017-06-06 DIAGNOSIS — H6122 Impacted cerumen, left ear: Secondary | ICD-10-CM | POA: Diagnosis not present

## 2017-06-06 DIAGNOSIS — E039 Hypothyroidism, unspecified: Secondary | ICD-10-CM | POA: Diagnosis not present

## 2017-06-06 DIAGNOSIS — M25511 Pain in right shoulder: Secondary | ICD-10-CM | POA: Diagnosis not present

## 2017-06-11 DIAGNOSIS — I10 Essential (primary) hypertension: Secondary | ICD-10-CM | POA: Diagnosis not present

## 2017-06-11 DIAGNOSIS — M7551 Bursitis of right shoulder: Secondary | ICD-10-CM | POA: Diagnosis not present

## 2017-06-11 DIAGNOSIS — E119 Type 2 diabetes mellitus without complications: Secondary | ICD-10-CM | POA: Diagnosis not present

## 2017-06-11 DIAGNOSIS — M19011 Primary osteoarthritis, right shoulder: Secondary | ICD-10-CM | POA: Diagnosis not present

## 2017-06-24 DIAGNOSIS — I10 Essential (primary) hypertension: Secondary | ICD-10-CM | POA: Diagnosis not present

## 2017-06-24 DIAGNOSIS — I255 Ischemic cardiomyopathy: Secondary | ICD-10-CM | POA: Diagnosis not present

## 2017-06-24 DIAGNOSIS — Z95810 Presence of automatic (implantable) cardiac defibrillator: Secondary | ICD-10-CM | POA: Diagnosis not present

## 2017-06-24 DIAGNOSIS — I420 Dilated cardiomyopathy: Secondary | ICD-10-CM | POA: Diagnosis not present

## 2017-06-24 DIAGNOSIS — E785 Hyperlipidemia, unspecified: Secondary | ICD-10-CM | POA: Diagnosis not present

## 2017-06-24 DIAGNOSIS — I2510 Atherosclerotic heart disease of native coronary artery without angina pectoris: Secondary | ICD-10-CM | POA: Diagnosis not present

## 2017-07-03 DIAGNOSIS — Z01818 Encounter for other preprocedural examination: Secondary | ICD-10-CM | POA: Diagnosis not present

## 2017-07-03 DIAGNOSIS — I5022 Chronic systolic (congestive) heart failure: Secondary | ICD-10-CM | POA: Diagnosis not present

## 2017-07-03 DIAGNOSIS — I2510 Atherosclerotic heart disease of native coronary artery without angina pectoris: Secondary | ICD-10-CM | POA: Diagnosis not present

## 2017-07-09 DIAGNOSIS — I2510 Atherosclerotic heart disease of native coronary artery without angina pectoris: Secondary | ICD-10-CM | POA: Diagnosis not present

## 2017-07-09 DIAGNOSIS — I5022 Chronic systolic (congestive) heart failure: Secondary | ICD-10-CM | POA: Diagnosis not present

## 2017-07-09 DIAGNOSIS — I252 Old myocardial infarction: Secondary | ICD-10-CM | POA: Diagnosis not present

## 2017-07-09 DIAGNOSIS — Z539 Procedure and treatment not carried out, unspecified reason: Secondary | ICD-10-CM | POA: Diagnosis not present

## 2017-07-09 DIAGNOSIS — Z95810 Presence of automatic (implantable) cardiac defibrillator: Secondary | ICD-10-CM | POA: Diagnosis not present

## 2017-07-09 DIAGNOSIS — Z87891 Personal history of nicotine dependence: Secondary | ICD-10-CM | POA: Diagnosis not present

## 2017-07-10 DIAGNOSIS — D693 Immune thrombocytopenic purpura: Secondary | ICD-10-CM | POA: Insufficient documentation

## 2017-07-10 DIAGNOSIS — D649 Anemia, unspecified: Secondary | ICD-10-CM | POA: Diagnosis not present

## 2017-07-10 DIAGNOSIS — D696 Thrombocytopenia, unspecified: Secondary | ICD-10-CM | POA: Diagnosis not present

## 2017-07-15 DIAGNOSIS — Z4502 Encounter for adjustment and management of automatic implantable cardiac defibrillator: Secondary | ICD-10-CM | POA: Diagnosis not present

## 2017-07-15 DIAGNOSIS — D696 Thrombocytopenia, unspecified: Secondary | ICD-10-CM | POA: Diagnosis not present

## 2017-07-16 DIAGNOSIS — Z79899 Other long term (current) drug therapy: Secondary | ICD-10-CM | POA: Diagnosis not present

## 2017-07-16 DIAGNOSIS — E119 Type 2 diabetes mellitus without complications: Secondary | ICD-10-CM | POA: Diagnosis not present

## 2017-07-16 DIAGNOSIS — I5022 Chronic systolic (congestive) heart failure: Secondary | ICD-10-CM | POA: Diagnosis not present

## 2017-07-16 DIAGNOSIS — I2510 Atherosclerotic heart disease of native coronary artery without angina pectoris: Secondary | ICD-10-CM | POA: Diagnosis not present

## 2017-07-16 DIAGNOSIS — I501 Left ventricular failure: Secondary | ICD-10-CM | POA: Diagnosis not present

## 2017-07-16 DIAGNOSIS — E785 Hyperlipidemia, unspecified: Secondary | ICD-10-CM | POA: Diagnosis not present

## 2017-07-16 DIAGNOSIS — I110 Hypertensive heart disease with heart failure: Secondary | ICD-10-CM | POA: Diagnosis not present

## 2017-07-16 DIAGNOSIS — I252 Old myocardial infarction: Secondary | ICD-10-CM | POA: Diagnosis not present

## 2017-07-16 DIAGNOSIS — Z7984 Long term (current) use of oral hypoglycemic drugs: Secondary | ICD-10-CM | POA: Diagnosis not present

## 2017-07-16 DIAGNOSIS — I255 Ischemic cardiomyopathy: Secondary | ICD-10-CM | POA: Diagnosis not present

## 2017-07-16 DIAGNOSIS — I25119 Atherosclerotic heart disease of native coronary artery with unspecified angina pectoris: Secondary | ICD-10-CM | POA: Diagnosis not present

## 2017-07-16 DIAGNOSIS — Z955 Presence of coronary angioplasty implant and graft: Secondary | ICD-10-CM | POA: Diagnosis not present

## 2017-08-14 DIAGNOSIS — D693 Immune thrombocytopenic purpura: Secondary | ICD-10-CM | POA: Diagnosis not present

## 2017-08-19 DIAGNOSIS — H35342 Macular cyst, hole, or pseudohole, left eye: Secondary | ICD-10-CM | POA: Diagnosis not present

## 2017-10-14 DIAGNOSIS — Z4502 Encounter for adjustment and management of automatic implantable cardiac defibrillator: Secondary | ICD-10-CM | POA: Diagnosis not present

## 2017-10-16 DIAGNOSIS — Z95810 Presence of automatic (implantable) cardiac defibrillator: Secondary | ICD-10-CM | POA: Diagnosis not present

## 2017-10-16 DIAGNOSIS — I2510 Atherosclerotic heart disease of native coronary artery without angina pectoris: Secondary | ICD-10-CM | POA: Diagnosis not present

## 2017-10-16 DIAGNOSIS — I255 Ischemic cardiomyopathy: Secondary | ICD-10-CM | POA: Diagnosis not present

## 2017-10-30 DIAGNOSIS — D696 Thrombocytopenia, unspecified: Secondary | ICD-10-CM | POA: Diagnosis not present

## 2017-10-30 DIAGNOSIS — D693 Immune thrombocytopenic purpura: Secondary | ICD-10-CM | POA: Diagnosis not present

## 2017-11-27 DIAGNOSIS — Z6824 Body mass index (BMI) 24.0-24.9, adult: Secondary | ICD-10-CM | POA: Diagnosis not present

## 2017-11-27 DIAGNOSIS — E119 Type 2 diabetes mellitus without complications: Secondary | ICD-10-CM | POA: Diagnosis not present

## 2017-11-27 DIAGNOSIS — Z79899 Other long term (current) drug therapy: Secondary | ICD-10-CM | POA: Diagnosis not present

## 2017-11-27 DIAGNOSIS — Z0000 Encounter for general adult medical examination without abnormal findings: Secondary | ICD-10-CM | POA: Diagnosis not present

## 2017-11-27 DIAGNOSIS — E039 Hypothyroidism, unspecified: Secondary | ICD-10-CM | POA: Diagnosis not present

## 2017-11-28 DIAGNOSIS — H6122 Impacted cerumen, left ear: Secondary | ICD-10-CM | POA: Diagnosis not present

## 2017-11-28 DIAGNOSIS — H6123 Impacted cerumen, bilateral: Secondary | ICD-10-CM | POA: Diagnosis not present

## 2017-11-28 DIAGNOSIS — H6121 Impacted cerumen, right ear: Secondary | ICD-10-CM | POA: Diagnosis not present

## 2018-01-13 DIAGNOSIS — J329 Chronic sinusitis, unspecified: Secondary | ICD-10-CM | POA: Diagnosis not present

## 2018-01-13 DIAGNOSIS — Z6824 Body mass index (BMI) 24.0-24.9, adult: Secondary | ICD-10-CM | POA: Diagnosis not present

## 2018-01-13 DIAGNOSIS — J40 Bronchitis, not specified as acute or chronic: Secondary | ICD-10-CM | POA: Diagnosis not present

## 2018-01-13 DIAGNOSIS — Z4502 Encounter for adjustment and management of automatic implantable cardiac defibrillator: Secondary | ICD-10-CM | POA: Diagnosis not present

## 2018-01-21 DIAGNOSIS — I255 Ischemic cardiomyopathy: Secondary | ICD-10-CM | POA: Diagnosis not present

## 2018-01-21 DIAGNOSIS — E785 Hyperlipidemia, unspecified: Secondary | ICD-10-CM | POA: Diagnosis not present

## 2018-01-21 DIAGNOSIS — Z95810 Presence of automatic (implantable) cardiac defibrillator: Secondary | ICD-10-CM | POA: Diagnosis not present

## 2018-01-21 DIAGNOSIS — Z955 Presence of coronary angioplasty implant and graft: Secondary | ICD-10-CM | POA: Insufficient documentation

## 2018-01-21 DIAGNOSIS — I5022 Chronic systolic (congestive) heart failure: Secondary | ICD-10-CM | POA: Diagnosis not present

## 2018-01-21 DIAGNOSIS — I2510 Atherosclerotic heart disease of native coronary artery without angina pectoris: Secondary | ICD-10-CM | POA: Diagnosis not present

## 2018-01-21 DIAGNOSIS — I252 Old myocardial infarction: Secondary | ICD-10-CM | POA: Diagnosis not present

## 2018-01-21 DIAGNOSIS — I420 Dilated cardiomyopathy: Secondary | ICD-10-CM | POA: Diagnosis not present

## 2018-02-04 DIAGNOSIS — Z23 Encounter for immunization: Secondary | ICD-10-CM | POA: Diagnosis not present

## 2018-02-05 DIAGNOSIS — D693 Immune thrombocytopenic purpura: Secondary | ICD-10-CM | POA: Diagnosis not present

## 2018-04-14 DIAGNOSIS — Z4502 Encounter for adjustment and management of automatic implantable cardiac defibrillator: Secondary | ICD-10-CM | POA: Diagnosis not present

## 2018-04-21 DIAGNOSIS — Z4502 Encounter for adjustment and management of automatic implantable cardiac defibrillator: Secondary | ICD-10-CM | POA: Diagnosis not present

## 2018-04-21 DIAGNOSIS — I255 Ischemic cardiomyopathy: Secondary | ICD-10-CM | POA: Diagnosis not present

## 2018-04-21 DIAGNOSIS — I420 Dilated cardiomyopathy: Secondary | ICD-10-CM | POA: Diagnosis not present

## 2018-05-13 DIAGNOSIS — D693 Immune thrombocytopenic purpura: Secondary | ICD-10-CM | POA: Diagnosis not present

## 2018-05-13 DIAGNOSIS — D696 Thrombocytopenia, unspecified: Secondary | ICD-10-CM | POA: Diagnosis not present

## 2018-06-09 DIAGNOSIS — Z6824 Body mass index (BMI) 24.0-24.9, adult: Secondary | ICD-10-CM | POA: Diagnosis not present

## 2018-06-09 DIAGNOSIS — Z79899 Other long term (current) drug therapy: Secondary | ICD-10-CM | POA: Diagnosis not present

## 2018-06-09 DIAGNOSIS — E789 Disorder of lipoprotein metabolism, unspecified: Secondary | ICD-10-CM | POA: Diagnosis not present

## 2018-06-09 DIAGNOSIS — E039 Hypothyroidism, unspecified: Secondary | ICD-10-CM | POA: Diagnosis not present

## 2018-06-09 DIAGNOSIS — E119 Type 2 diabetes mellitus without complications: Secondary | ICD-10-CM | POA: Diagnosis not present

## 2018-07-21 DIAGNOSIS — Z4502 Encounter for adjustment and management of automatic implantable cardiac defibrillator: Secondary | ICD-10-CM | POA: Diagnosis not present

## 2018-08-13 DIAGNOSIS — D649 Anemia, unspecified: Secondary | ICD-10-CM | POA: Diagnosis not present

## 2018-08-13 DIAGNOSIS — D693 Immune thrombocytopenic purpura: Secondary | ICD-10-CM | POA: Diagnosis not present

## 2018-08-13 DIAGNOSIS — D696 Thrombocytopenia, unspecified: Secondary | ICD-10-CM | POA: Diagnosis not present

## 2018-08-27 DIAGNOSIS — I420 Dilated cardiomyopathy: Secondary | ICD-10-CM | POA: Diagnosis not present

## 2018-08-27 DIAGNOSIS — I255 Ischemic cardiomyopathy: Secondary | ICD-10-CM | POA: Diagnosis not present

## 2018-08-27 DIAGNOSIS — Z95810 Presence of automatic (implantable) cardiac defibrillator: Secondary | ICD-10-CM | POA: Diagnosis not present

## 2018-08-27 DIAGNOSIS — I2510 Atherosclerotic heart disease of native coronary artery without angina pectoris: Secondary | ICD-10-CM | POA: Diagnosis not present

## 2018-08-27 DIAGNOSIS — I110 Hypertensive heart disease with heart failure: Secondary | ICD-10-CM | POA: Diagnosis not present

## 2018-08-27 DIAGNOSIS — E785 Hyperlipidemia, unspecified: Secondary | ICD-10-CM | POA: Diagnosis not present

## 2018-08-27 DIAGNOSIS — Z955 Presence of coronary angioplasty implant and graft: Secondary | ICD-10-CM | POA: Diagnosis not present

## 2018-08-27 DIAGNOSIS — I252 Old myocardial infarction: Secondary | ICD-10-CM | POA: Diagnosis not present

## 2018-08-27 DIAGNOSIS — I5022 Chronic systolic (congestive) heart failure: Secondary | ICD-10-CM | POA: Diagnosis not present

## 2018-10-20 DIAGNOSIS — Z4502 Encounter for adjustment and management of automatic implantable cardiac defibrillator: Secondary | ICD-10-CM | POA: Diagnosis not present

## 2018-11-06 ENCOUNTER — Other Ambulatory Visit: Payer: Self-pay

## 2018-11-10 DIAGNOSIS — E119 Type 2 diabetes mellitus without complications: Secondary | ICD-10-CM | POA: Diagnosis not present

## 2018-11-10 DIAGNOSIS — Z79899 Other long term (current) drug therapy: Secondary | ICD-10-CM | POA: Diagnosis not present

## 2018-11-10 DIAGNOSIS — E039 Hypothyroidism, unspecified: Secondary | ICD-10-CM | POA: Diagnosis not present

## 2018-11-10 DIAGNOSIS — Z114 Encounter for screening for human immunodeficiency virus [HIV]: Secondary | ICD-10-CM | POA: Diagnosis not present

## 2018-11-10 DIAGNOSIS — Z6823 Body mass index (BMI) 23.0-23.9, adult: Secondary | ICD-10-CM | POA: Diagnosis not present

## 2018-11-13 DIAGNOSIS — D693 Immune thrombocytopenic purpura: Secondary | ICD-10-CM | POA: Diagnosis not present

## 2018-11-18 DIAGNOSIS — E039 Hypothyroidism, unspecified: Secondary | ICD-10-CM | POA: Diagnosis not present

## 2019-01-03 DIAGNOSIS — Z23 Encounter for immunization: Secondary | ICD-10-CM | POA: Diagnosis not present

## 2019-01-19 DIAGNOSIS — Z4502 Encounter for adjustment and management of automatic implantable cardiac defibrillator: Secondary | ICD-10-CM | POA: Diagnosis not present

## 2019-02-12 DIAGNOSIS — D693 Immune thrombocytopenic purpura: Secondary | ICD-10-CM | POA: Diagnosis not present

## 2019-02-27 ENCOUNTER — Other Ambulatory Visit: Payer: Self-pay

## 2019-03-02 DIAGNOSIS — E785 Hyperlipidemia, unspecified: Secondary | ICD-10-CM | POA: Diagnosis not present

## 2019-03-02 DIAGNOSIS — I255 Ischemic cardiomyopathy: Secondary | ICD-10-CM | POA: Diagnosis not present

## 2019-03-02 DIAGNOSIS — I110 Hypertensive heart disease with heart failure: Secondary | ICD-10-CM | POA: Diagnosis not present

## 2019-03-02 DIAGNOSIS — D696 Thrombocytopenia, unspecified: Secondary | ICD-10-CM | POA: Diagnosis not present

## 2019-03-02 DIAGNOSIS — Z95810 Presence of automatic (implantable) cardiac defibrillator: Secondary | ICD-10-CM | POA: Diagnosis not present

## 2019-03-02 DIAGNOSIS — I5022 Chronic systolic (congestive) heart failure: Secondary | ICD-10-CM | POA: Diagnosis not present

## 2019-03-02 DIAGNOSIS — Z955 Presence of coronary angioplasty implant and graft: Secondary | ICD-10-CM | POA: Diagnosis not present

## 2019-03-02 DIAGNOSIS — I252 Old myocardial infarction: Secondary | ICD-10-CM | POA: Diagnosis not present

## 2019-03-02 DIAGNOSIS — I2510 Atherosclerotic heart disease of native coronary artery without angina pectoris: Secondary | ICD-10-CM | POA: Diagnosis not present

## 2019-03-02 DIAGNOSIS — I420 Dilated cardiomyopathy: Secondary | ICD-10-CM | POA: Diagnosis not present

## 2019-04-17 DIAGNOSIS — Z9181 History of falling: Secondary | ICD-10-CM | POA: Diagnosis not present

## 2019-04-17 DIAGNOSIS — Z0000 Encounter for general adult medical examination without abnormal findings: Secondary | ICD-10-CM | POA: Diagnosis not present

## 2019-04-17 DIAGNOSIS — Z1331 Encounter for screening for depression: Secondary | ICD-10-CM | POA: Diagnosis not present

## 2019-04-17 DIAGNOSIS — K4090 Unilateral inguinal hernia, without obstruction or gangrene, not specified as recurrent: Secondary | ICD-10-CM | POA: Diagnosis not present

## 2019-04-20 DIAGNOSIS — Z4502 Encounter for adjustment and management of automatic implantable cardiac defibrillator: Secondary | ICD-10-CM | POA: Diagnosis not present

## 2019-04-24 DIAGNOSIS — I10 Essential (primary) hypertension: Secondary | ICD-10-CM | POA: Diagnosis not present

## 2019-04-24 DIAGNOSIS — E785 Hyperlipidemia, unspecified: Secondary | ICD-10-CM | POA: Diagnosis not present

## 2019-04-24 DIAGNOSIS — E119 Type 2 diabetes mellitus without complications: Secondary | ICD-10-CM | POA: Diagnosis not present

## 2019-04-28 DIAGNOSIS — D696 Thrombocytopenia, unspecified: Secondary | ICD-10-CM | POA: Diagnosis not present

## 2019-04-28 DIAGNOSIS — E785 Hyperlipidemia, unspecified: Secondary | ICD-10-CM | POA: Diagnosis not present

## 2019-04-28 DIAGNOSIS — E119 Type 2 diabetes mellitus without complications: Secondary | ICD-10-CM | POA: Diagnosis not present

## 2019-04-28 DIAGNOSIS — K4090 Unilateral inguinal hernia, without obstruction or gangrene, not specified as recurrent: Secondary | ICD-10-CM | POA: Diagnosis not present

## 2019-04-28 DIAGNOSIS — I509 Heart failure, unspecified: Secondary | ICD-10-CM | POA: Diagnosis not present

## 2019-04-28 DIAGNOSIS — I2510 Atherosclerotic heart disease of native coronary artery without angina pectoris: Secondary | ICD-10-CM | POA: Diagnosis not present

## 2019-04-28 DIAGNOSIS — I110 Hypertensive heart disease with heart failure: Secondary | ICD-10-CM | POA: Diagnosis not present

## 2019-05-08 DIAGNOSIS — E119 Type 2 diabetes mellitus without complications: Secondary | ICD-10-CM | POA: Diagnosis not present

## 2019-05-08 DIAGNOSIS — H35372 Puckering of macula, left eye: Secondary | ICD-10-CM | POA: Diagnosis not present

## 2019-05-19 DIAGNOSIS — I2510 Atherosclerotic heart disease of native coronary artery without angina pectoris: Secondary | ICD-10-CM | POA: Diagnosis not present

## 2019-05-19 DIAGNOSIS — Z01812 Encounter for preprocedural laboratory examination: Secondary | ICD-10-CM | POA: Diagnosis not present

## 2019-05-19 DIAGNOSIS — I252 Old myocardial infarction: Secondary | ICD-10-CM | POA: Diagnosis not present

## 2019-05-19 DIAGNOSIS — Z20822 Contact with and (suspected) exposure to covid-19: Secondary | ICD-10-CM | POA: Diagnosis not present

## 2019-05-19 DIAGNOSIS — Z950 Presence of cardiac pacemaker: Secondary | ICD-10-CM | POA: Diagnosis not present

## 2019-05-19 DIAGNOSIS — Z87891 Personal history of nicotine dependence: Secondary | ICD-10-CM | POA: Diagnosis not present

## 2019-05-19 DIAGNOSIS — Z7984 Long term (current) use of oral hypoglycemic drugs: Secondary | ICD-10-CM | POA: Diagnosis not present

## 2019-05-19 DIAGNOSIS — K4090 Unilateral inguinal hernia, without obstruction or gangrene, not specified as recurrent: Secondary | ICD-10-CM | POA: Diagnosis not present

## 2019-05-19 DIAGNOSIS — E119 Type 2 diabetes mellitus without complications: Secondary | ICD-10-CM | POA: Diagnosis not present

## 2019-05-25 DIAGNOSIS — I509 Heart failure, unspecified: Secondary | ICD-10-CM | POA: Diagnosis not present

## 2019-05-25 DIAGNOSIS — Z01810 Encounter for preprocedural cardiovascular examination: Secondary | ICD-10-CM | POA: Diagnosis not present

## 2019-05-25 DIAGNOSIS — Z950 Presence of cardiac pacemaker: Secondary | ICD-10-CM | POA: Diagnosis not present

## 2019-05-25 DIAGNOSIS — Z951 Presence of aortocoronary bypass graft: Secondary | ICD-10-CM | POA: Diagnosis not present

## 2019-05-25 DIAGNOSIS — K4090 Unilateral inguinal hernia, without obstruction or gangrene, not specified as recurrent: Secondary | ICD-10-CM | POA: Diagnosis not present

## 2019-05-25 DIAGNOSIS — I252 Old myocardial infarction: Secondary | ICD-10-CM | POA: Diagnosis not present

## 2019-05-25 DIAGNOSIS — Z87891 Personal history of nicotine dependence: Secondary | ICD-10-CM | POA: Diagnosis not present

## 2019-05-25 DIAGNOSIS — E119 Type 2 diabetes mellitus without complications: Secondary | ICD-10-CM | POA: Diagnosis not present

## 2019-05-25 DIAGNOSIS — I2510 Atherosclerotic heart disease of native coronary artery without angina pectoris: Secondary | ICD-10-CM | POA: Diagnosis not present

## 2019-05-25 DIAGNOSIS — Z8679 Personal history of other diseases of the circulatory system: Secondary | ICD-10-CM | POA: Diagnosis not present

## 2019-05-25 DIAGNOSIS — D176 Benign lipomatous neoplasm of spermatic cord: Secondary | ICD-10-CM | POA: Diagnosis not present

## 2019-05-25 DIAGNOSIS — Z7984 Long term (current) use of oral hypoglycemic drugs: Secondary | ICD-10-CM | POA: Diagnosis not present

## 2019-07-20 DIAGNOSIS — Z4502 Encounter for adjustment and management of automatic implantable cardiac defibrillator: Secondary | ICD-10-CM | POA: Diagnosis not present

## 2019-08-13 DIAGNOSIS — Z79899 Other long term (current) drug therapy: Secondary | ICD-10-CM | POA: Diagnosis not present

## 2019-08-13 DIAGNOSIS — D696 Thrombocytopenia, unspecified: Secondary | ICD-10-CM | POA: Diagnosis not present

## 2019-08-13 DIAGNOSIS — D693 Immune thrombocytopenic purpura: Secondary | ICD-10-CM | POA: Diagnosis not present

## 2019-08-13 DIAGNOSIS — D649 Anemia, unspecified: Secondary | ICD-10-CM | POA: Diagnosis not present

## 2019-08-24 DIAGNOSIS — D696 Thrombocytopenia, unspecified: Secondary | ICD-10-CM | POA: Diagnosis not present

## 2019-08-26 DIAGNOSIS — E789 Disorder of lipoprotein metabolism, unspecified: Secondary | ICD-10-CM | POA: Diagnosis not present

## 2019-08-26 DIAGNOSIS — E039 Hypothyroidism, unspecified: Secondary | ICD-10-CM | POA: Diagnosis not present

## 2019-08-26 DIAGNOSIS — Z79899 Other long term (current) drug therapy: Secondary | ICD-10-CM | POA: Diagnosis not present

## 2019-08-26 DIAGNOSIS — Z6824 Body mass index (BMI) 24.0-24.9, adult: Secondary | ICD-10-CM | POA: Diagnosis not present

## 2019-08-26 DIAGNOSIS — E1165 Type 2 diabetes mellitus with hyperglycemia: Secondary | ICD-10-CM | POA: Diagnosis not present

## 2019-08-31 DIAGNOSIS — I255 Ischemic cardiomyopathy: Secondary | ICD-10-CM | POA: Diagnosis not present

## 2019-08-31 DIAGNOSIS — I252 Old myocardial infarction: Secondary | ICD-10-CM | POA: Diagnosis not present

## 2019-08-31 DIAGNOSIS — I5022 Chronic systolic (congestive) heart failure: Secondary | ICD-10-CM | POA: Diagnosis not present

## 2019-08-31 DIAGNOSIS — I2510 Atherosclerotic heart disease of native coronary artery without angina pectoris: Secondary | ICD-10-CM | POA: Diagnosis not present

## 2019-10-19 DIAGNOSIS — Z4502 Encounter for adjustment and management of automatic implantable cardiac defibrillator: Secondary | ICD-10-CM | POA: Diagnosis not present

## 2019-12-03 DIAGNOSIS — D693 Immune thrombocytopenic purpura: Secondary | ICD-10-CM | POA: Diagnosis not present

## 2020-02-08 DIAGNOSIS — H903 Sensorineural hearing loss, bilateral: Secondary | ICD-10-CM | POA: Diagnosis not present

## 2020-02-08 DIAGNOSIS — H9202 Otalgia, left ear: Secondary | ICD-10-CM | POA: Diagnosis not present

## 2020-02-08 DIAGNOSIS — H6123 Impacted cerumen, bilateral: Secondary | ICD-10-CM | POA: Diagnosis not present

## 2020-02-08 DIAGNOSIS — D693 Immune thrombocytopenic purpura: Secondary | ICD-10-CM | POA: Diagnosis not present

## 2020-02-26 DIAGNOSIS — Z23 Encounter for immunization: Secondary | ICD-10-CM | POA: Diagnosis not present

## 2020-03-09 DIAGNOSIS — I2510 Atherosclerotic heart disease of native coronary artery without angina pectoris: Secondary | ICD-10-CM | POA: Diagnosis not present

## 2020-03-09 DIAGNOSIS — I255 Ischemic cardiomyopathy: Secondary | ICD-10-CM | POA: Diagnosis not present

## 2020-03-09 DIAGNOSIS — Z955 Presence of coronary angioplasty implant and graft: Secondary | ICD-10-CM | POA: Diagnosis not present

## 2020-03-09 DIAGNOSIS — I5022 Chronic systolic (congestive) heart failure: Secondary | ICD-10-CM | POA: Diagnosis not present

## 2020-03-09 DIAGNOSIS — Z95810 Presence of automatic (implantable) cardiac defibrillator: Secondary | ICD-10-CM | POA: Diagnosis not present

## 2020-03-09 DIAGNOSIS — I420 Dilated cardiomyopathy: Secondary | ICD-10-CM | POA: Diagnosis not present

## 2020-03-09 DIAGNOSIS — I252 Old myocardial infarction: Secondary | ICD-10-CM | POA: Diagnosis not present

## 2020-03-09 DIAGNOSIS — E785 Hyperlipidemia, unspecified: Secondary | ICD-10-CM | POA: Diagnosis not present

## 2020-03-23 DIAGNOSIS — E782 Mixed hyperlipidemia: Secondary | ICD-10-CM | POA: Diagnosis not present

## 2020-03-23 DIAGNOSIS — E119 Type 2 diabetes mellitus without complications: Secondary | ICD-10-CM | POA: Diagnosis not present

## 2020-03-23 DIAGNOSIS — I10 Essential (primary) hypertension: Secondary | ICD-10-CM | POA: Diagnosis not present

## 2020-04-07 DIAGNOSIS — Z23 Encounter for immunization: Secondary | ICD-10-CM | POA: Diagnosis not present

## 2020-04-21 DIAGNOSIS — I5022 Chronic systolic (congestive) heart failure: Secondary | ICD-10-CM | POA: Diagnosis not present

## 2020-04-21 DIAGNOSIS — Z4502 Encounter for adjustment and management of automatic implantable cardiac defibrillator: Secondary | ICD-10-CM | POA: Diagnosis not present

## 2020-04-21 DIAGNOSIS — I2510 Atherosclerotic heart disease of native coronary artery without angina pectoris: Secondary | ICD-10-CM | POA: Diagnosis not present

## 2020-05-12 DIAGNOSIS — D693 Immune thrombocytopenic purpura: Secondary | ICD-10-CM | POA: Diagnosis not present

## 2020-05-26 DIAGNOSIS — E119 Type 2 diabetes mellitus without complications: Secondary | ICD-10-CM | POA: Diagnosis not present

## 2020-05-26 DIAGNOSIS — H35372 Puckering of macula, left eye: Secondary | ICD-10-CM | POA: Diagnosis not present

## 2020-07-06 DIAGNOSIS — E1165 Type 2 diabetes mellitus with hyperglycemia: Secondary | ICD-10-CM | POA: Diagnosis not present

## 2020-07-06 DIAGNOSIS — E039 Hypothyroidism, unspecified: Secondary | ICD-10-CM | POA: Diagnosis not present

## 2020-07-06 DIAGNOSIS — I10 Essential (primary) hypertension: Secondary | ICD-10-CM | POA: Diagnosis not present

## 2020-07-14 DIAGNOSIS — Z8673 Personal history of transient ischemic attack (TIA), and cerebral infarction without residual deficits: Secondary | ICD-10-CM | POA: Diagnosis not present

## 2020-07-14 DIAGNOSIS — Z6822 Body mass index (BMI) 22.0-22.9, adult: Secondary | ICD-10-CM | POA: Diagnosis not present

## 2020-07-14 DIAGNOSIS — E1165 Type 2 diabetes mellitus with hyperglycemia: Secondary | ICD-10-CM | POA: Diagnosis not present

## 2020-07-20 ENCOUNTER — Other Ambulatory Visit: Payer: Self-pay | Admitting: Family Medicine

## 2020-07-20 DIAGNOSIS — I10 Essential (primary) hypertension: Secondary | ICD-10-CM | POA: Diagnosis not present

## 2020-07-20 DIAGNOSIS — Z8673 Personal history of transient ischemic attack (TIA), and cerebral infarction without residual deficits: Secondary | ICD-10-CM

## 2020-07-20 DIAGNOSIS — Z7984 Long term (current) use of oral hypoglycemic drugs: Secondary | ICD-10-CM | POA: Diagnosis not present

## 2020-07-20 DIAGNOSIS — E119 Type 2 diabetes mellitus without complications: Secondary | ICD-10-CM | POA: Diagnosis not present

## 2020-07-20 DIAGNOSIS — E782 Mixed hyperlipidemia: Secondary | ICD-10-CM | POA: Diagnosis not present

## 2020-07-27 ENCOUNTER — Other Ambulatory Visit (HOSPITAL_COMMUNITY): Payer: Self-pay | Admitting: Family Medicine

## 2020-07-27 DIAGNOSIS — Z8673 Personal history of transient ischemic attack (TIA), and cerebral infarction without residual deficits: Secondary | ICD-10-CM

## 2020-08-18 DIAGNOSIS — D696 Thrombocytopenia, unspecified: Secondary | ICD-10-CM | POA: Diagnosis not present

## 2020-08-18 DIAGNOSIS — D7589 Other specified diseases of blood and blood-forming organs: Secondary | ICD-10-CM | POA: Diagnosis not present

## 2020-08-18 DIAGNOSIS — Z8673 Personal history of transient ischemic attack (TIA), and cerebral infarction without residual deficits: Secondary | ICD-10-CM | POA: Diagnosis not present

## 2020-08-18 DIAGNOSIS — D693 Immune thrombocytopenic purpura: Secondary | ICD-10-CM | POA: Diagnosis not present

## 2020-08-24 ENCOUNTER — Ambulatory Visit (HOSPITAL_COMMUNITY)
Admission: RE | Admit: 2020-08-24 | Discharge: 2020-08-24 | Disposition: A | Discharge status: Home / Self Care | Payer: Medicare Other | Source: Ambulatory Visit | Attending: Family Medicine | Admitting: Family Medicine

## 2020-08-24 ENCOUNTER — Other Ambulatory Visit: Payer: Self-pay

## 2020-08-24 ENCOUNTER — Ambulatory Visit (HOSPITAL_COMMUNITY)
Admission: RE | Admit: 2020-08-24 | Discharge: 2020-08-24 | Disposition: A | Discharge status: Home / Self Care | Payer: Medicare Other | Source: Ambulatory Visit | Attending: Student | Admitting: Student

## 2020-08-24 DIAGNOSIS — Z01818 Encounter for other preprocedural examination: Secondary | ICD-10-CM | POA: Diagnosis not present

## 2020-08-24 DIAGNOSIS — Z950 Presence of cardiac pacemaker: Secondary | ICD-10-CM | POA: Insufficient documentation

## 2020-08-24 DIAGNOSIS — G319 Degenerative disease of nervous system, unspecified: Secondary | ICD-10-CM | POA: Diagnosis not present

## 2020-08-24 DIAGNOSIS — Z8673 Personal history of transient ischemic attack (TIA), and cerebral infarction without residual deficits: Secondary | ICD-10-CM | POA: Insufficient documentation

## 2020-08-24 NOTE — Progress Notes (Signed)
Informed of MRI for today.   Device system confirmed to be MRI conditional, with implant date > 6 weeks ago and no evidence of abandoned or epicardial leads in review of most recent CXR Interrogation from today reviewed, pt is currently AP-VP at ~60-65 bpm Change device settings for MRI to DOO at 85 bpm  Tachy-therapies to off if applicable.  Program device back to pre-MRI settings after completion of exam.  Annamaria Helling  08/24/2020 1:03 PM

## 2020-08-24 NOTE — Progress Notes (Signed)
Per order, Changed device settings for MRI to DOO at 85 bpm   Tachy-therapies to off if applicable.   Will program device back to pre-MRI settings after completion of exam and send report.

## 2020-11-21 DIAGNOSIS — D7589 Other specified diseases of blood and blood-forming organs: Secondary | ICD-10-CM | POA: Diagnosis not present

## 2020-11-21 DIAGNOSIS — D693 Immune thrombocytopenic purpura: Secondary | ICD-10-CM | POA: Diagnosis not present

## 2020-11-22 DIAGNOSIS — E119 Type 2 diabetes mellitus without complications: Secondary | ICD-10-CM | POA: Diagnosis not present

## 2020-11-22 DIAGNOSIS — Z7984 Long term (current) use of oral hypoglycemic drugs: Secondary | ICD-10-CM | POA: Diagnosis not present

## 2020-11-22 DIAGNOSIS — I10 Essential (primary) hypertension: Secondary | ICD-10-CM | POA: Diagnosis not present

## 2020-11-22 DIAGNOSIS — E782 Mixed hyperlipidemia: Secondary | ICD-10-CM | POA: Diagnosis not present

## 2020-12-08 DIAGNOSIS — D693 Immune thrombocytopenic purpura: Secondary | ICD-10-CM | POA: Diagnosis not present

## 2020-12-08 DIAGNOSIS — D696 Thrombocytopenia, unspecified: Secondary | ICD-10-CM | POA: Diagnosis not present

## 2020-12-08 DIAGNOSIS — D7589 Other specified diseases of blood and blood-forming organs: Secondary | ICD-10-CM | POA: Diagnosis not present

## 2020-12-16 DIAGNOSIS — D6489 Other specified anemias: Secondary | ICD-10-CM | POA: Diagnosis not present

## 2020-12-16 DIAGNOSIS — D693 Immune thrombocytopenic purpura: Secondary | ICD-10-CM | POA: Diagnosis not present

## 2020-12-16 DIAGNOSIS — D638 Anemia in other chronic diseases classified elsewhere: Secondary | ICD-10-CM | POA: Insufficient documentation

## 2021-01-13 DIAGNOSIS — I252 Old myocardial infarction: Secondary | ICD-10-CM | POA: Diagnosis not present

## 2021-01-13 DIAGNOSIS — Z95810 Presence of automatic (implantable) cardiac defibrillator: Secondary | ICD-10-CM | POA: Diagnosis not present

## 2021-01-13 DIAGNOSIS — I10 Essential (primary) hypertension: Secondary | ICD-10-CM | POA: Diagnosis not present

## 2021-01-13 DIAGNOSIS — I2510 Atherosclerotic heart disease of native coronary artery without angina pectoris: Secondary | ICD-10-CM | POA: Diagnosis not present

## 2021-01-13 DIAGNOSIS — I420 Dilated cardiomyopathy: Secondary | ICD-10-CM | POA: Diagnosis not present

## 2021-01-13 DIAGNOSIS — I255 Ischemic cardiomyopathy: Secondary | ICD-10-CM | POA: Diagnosis not present

## 2021-01-13 DIAGNOSIS — E785 Hyperlipidemia, unspecified: Secondary | ICD-10-CM | POA: Diagnosis not present

## 2021-01-16 DIAGNOSIS — Z23 Encounter for immunization: Secondary | ICD-10-CM | POA: Diagnosis not present

## 2021-01-23 DIAGNOSIS — Z23 Encounter for immunization: Secondary | ICD-10-CM | POA: Diagnosis not present

## 2021-02-23 DIAGNOSIS — D693 Immune thrombocytopenic purpura: Secondary | ICD-10-CM | POA: Diagnosis not present

## 2021-03-04 DIAGNOSIS — R051 Acute cough: Secondary | ICD-10-CM | POA: Diagnosis not present

## 2021-03-04 DIAGNOSIS — J209 Acute bronchitis, unspecified: Secondary | ICD-10-CM | POA: Diagnosis not present

## 2021-03-08 DIAGNOSIS — J40 Bronchitis, not specified as acute or chronic: Secondary | ICD-10-CM | POA: Diagnosis not present

## 2021-03-08 DIAGNOSIS — J329 Chronic sinusitis, unspecified: Secondary | ICD-10-CM | POA: Diagnosis not present

## 2021-03-08 DIAGNOSIS — Z20828 Contact with and (suspected) exposure to other viral communicable diseases: Secondary | ICD-10-CM | POA: Diagnosis not present

## 2021-03-21 DIAGNOSIS — Z8673 Personal history of transient ischemic attack (TIA), and cerebral infarction without residual deficits: Secondary | ICD-10-CM | POA: Diagnosis not present

## 2021-03-21 DIAGNOSIS — D638 Anemia in other chronic diseases classified elsewhere: Secondary | ICD-10-CM | POA: Diagnosis not present

## 2021-03-21 DIAGNOSIS — D693 Immune thrombocytopenic purpura: Secondary | ICD-10-CM | POA: Diagnosis not present

## 2021-03-30 DIAGNOSIS — Z7984 Long term (current) use of oral hypoglycemic drugs: Secondary | ICD-10-CM | POA: Diagnosis not present

## 2021-03-30 DIAGNOSIS — E119 Type 2 diabetes mellitus without complications: Secondary | ICD-10-CM | POA: Diagnosis not present

## 2021-03-30 DIAGNOSIS — E785 Hyperlipidemia, unspecified: Secondary | ICD-10-CM | POA: Diagnosis not present

## 2021-03-30 DIAGNOSIS — I10 Essential (primary) hypertension: Secondary | ICD-10-CM | POA: Diagnosis not present

## 2021-03-30 DIAGNOSIS — Z87891 Personal history of nicotine dependence: Secondary | ICD-10-CM | POA: Diagnosis not present

## 2021-04-19 DIAGNOSIS — Z6821 Body mass index (BMI) 21.0-21.9, adult: Secondary | ICD-10-CM | POA: Diagnosis not present

## 2021-04-19 DIAGNOSIS — Z79899 Other long term (current) drug therapy: Secondary | ICD-10-CM | POA: Diagnosis not present

## 2021-04-19 DIAGNOSIS — E789 Disorder of lipoprotein metabolism, unspecified: Secondary | ICD-10-CM | POA: Diagnosis not present

## 2021-04-19 DIAGNOSIS — Z0000 Encounter for general adult medical examination without abnormal findings: Secondary | ICD-10-CM | POA: Diagnosis not present

## 2021-04-19 DIAGNOSIS — E039 Hypothyroidism, unspecified: Secondary | ICD-10-CM | POA: Diagnosis not present

## 2021-04-19 DIAGNOSIS — E1165 Type 2 diabetes mellitus with hyperglycemia: Secondary | ICD-10-CM | POA: Diagnosis not present

## 2021-04-19 DIAGNOSIS — Z1331 Encounter for screening for depression: Secondary | ICD-10-CM | POA: Diagnosis not present

## 2021-05-04 DIAGNOSIS — Z4502 Encounter for adjustment and management of automatic implantable cardiac defibrillator: Secondary | ICD-10-CM | POA: Diagnosis not present

## 2021-05-04 DIAGNOSIS — I5022 Chronic systolic (congestive) heart failure: Secondary | ICD-10-CM | POA: Diagnosis not present

## 2021-05-25 DIAGNOSIS — E119 Type 2 diabetes mellitus without complications: Secondary | ICD-10-CM | POA: Diagnosis not present

## 2021-05-25 DIAGNOSIS — Z87891 Personal history of nicotine dependence: Secondary | ICD-10-CM | POA: Diagnosis not present

## 2021-05-25 DIAGNOSIS — I10 Essential (primary) hypertension: Secondary | ICD-10-CM | POA: Diagnosis not present

## 2021-05-25 DIAGNOSIS — Z7984 Long term (current) use of oral hypoglycemic drugs: Secondary | ICD-10-CM | POA: Diagnosis not present

## 2021-05-25 DIAGNOSIS — E785 Hyperlipidemia, unspecified: Secondary | ICD-10-CM | POA: Diagnosis not present

## 2021-06-08 DIAGNOSIS — E1165 Type 2 diabetes mellitus with hyperglycemia: Secondary | ICD-10-CM | POA: Diagnosis not present

## 2021-06-08 DIAGNOSIS — Z79899 Other long term (current) drug therapy: Secondary | ICD-10-CM | POA: Diagnosis not present

## 2021-06-19 DIAGNOSIS — D696 Thrombocytopenia, unspecified: Secondary | ICD-10-CM | POA: Diagnosis not present

## 2021-07-07 DIAGNOSIS — E785 Hyperlipidemia, unspecified: Secondary | ICD-10-CM | POA: Diagnosis not present

## 2021-07-07 DIAGNOSIS — I10 Essential (primary) hypertension: Secondary | ICD-10-CM | POA: Diagnosis not present

## 2021-07-20 DIAGNOSIS — E039 Hypothyroidism, unspecified: Secondary | ICD-10-CM | POA: Diagnosis not present

## 2021-07-20 DIAGNOSIS — E1165 Type 2 diabetes mellitus with hyperglycemia: Secondary | ICD-10-CM | POA: Diagnosis not present

## 2021-07-20 DIAGNOSIS — Z6821 Body mass index (BMI) 21.0-21.9, adult: Secondary | ICD-10-CM | POA: Diagnosis not present

## 2021-08-17 DIAGNOSIS — Z4502 Encounter for adjustment and management of automatic implantable cardiac defibrillator: Secondary | ICD-10-CM | POA: Diagnosis not present

## 2021-08-28 DIAGNOSIS — Z8673 Personal history of transient ischemic attack (TIA), and cerebral infarction without residual deficits: Secondary | ICD-10-CM | POA: Diagnosis not present

## 2021-08-28 DIAGNOSIS — D693 Immune thrombocytopenic purpura: Secondary | ICD-10-CM | POA: Diagnosis not present

## 2021-08-28 DIAGNOSIS — D638 Anemia in other chronic diseases classified elsewhere: Secondary | ICD-10-CM | POA: Diagnosis not present

## 2021-08-28 DIAGNOSIS — D696 Thrombocytopenia, unspecified: Secondary | ICD-10-CM | POA: Diagnosis not present

## 2021-09-21 DIAGNOSIS — D693 Immune thrombocytopenic purpura: Secondary | ICD-10-CM | POA: Diagnosis not present

## 2021-10-13 DIAGNOSIS — R531 Weakness: Secondary | ICD-10-CM | POA: Diagnosis not present

## 2021-10-13 DIAGNOSIS — I5022 Chronic systolic (congestive) heart failure: Secondary | ICD-10-CM | POA: Diagnosis not present

## 2021-10-13 DIAGNOSIS — Z95810 Presence of automatic (implantable) cardiac defibrillator: Secondary | ICD-10-CM | POA: Diagnosis not present

## 2021-10-13 DIAGNOSIS — E079 Disorder of thyroid, unspecified: Secondary | ICD-10-CM | POA: Diagnosis not present

## 2021-10-13 DIAGNOSIS — I2510 Atherosclerotic heart disease of native coronary artery without angina pectoris: Secondary | ICD-10-CM | POA: Diagnosis not present

## 2021-10-13 DIAGNOSIS — I110 Hypertensive heart disease with heart failure: Secondary | ICD-10-CM | POA: Diagnosis not present

## 2021-10-13 DIAGNOSIS — D693 Immune thrombocytopenic purpura: Secondary | ICD-10-CM | POA: Diagnosis not present

## 2021-10-13 DIAGNOSIS — I252 Old myocardial infarction: Secondary | ICD-10-CM | POA: Diagnosis not present

## 2021-10-13 DIAGNOSIS — I420 Dilated cardiomyopathy: Secondary | ICD-10-CM | POA: Diagnosis not present

## 2021-10-13 DIAGNOSIS — I255 Ischemic cardiomyopathy: Secondary | ICD-10-CM | POA: Diagnosis not present

## 2021-10-20 DIAGNOSIS — I420 Dilated cardiomyopathy: Secondary | ICD-10-CM | POA: Diagnosis not present

## 2021-10-20 DIAGNOSIS — I361 Nonrheumatic tricuspid (valve) insufficiency: Secondary | ICD-10-CM | POA: Diagnosis not present

## 2021-10-20 DIAGNOSIS — I255 Ischemic cardiomyopathy: Secondary | ICD-10-CM | POA: Diagnosis not present

## 2021-10-20 DIAGNOSIS — Z95810 Presence of automatic (implantable) cardiac defibrillator: Secondary | ICD-10-CM | POA: Diagnosis not present

## 2021-10-27 DIAGNOSIS — Z6821 Body mass index (BMI) 21.0-21.9, adult: Secondary | ICD-10-CM | POA: Diagnosis not present

## 2021-10-27 DIAGNOSIS — E039 Hypothyroidism, unspecified: Secondary | ICD-10-CM | POA: Diagnosis not present

## 2021-11-02 DIAGNOSIS — I252 Old myocardial infarction: Secondary | ICD-10-CM | POA: Diagnosis not present

## 2021-11-02 DIAGNOSIS — I255 Ischemic cardiomyopathy: Secondary | ICD-10-CM | POA: Diagnosis not present

## 2021-11-02 DIAGNOSIS — R5383 Other fatigue: Secondary | ICD-10-CM | POA: Insufficient documentation

## 2021-11-02 DIAGNOSIS — Z4502 Encounter for adjustment and management of automatic implantable cardiac defibrillator: Secondary | ICD-10-CM | POA: Diagnosis not present

## 2021-11-02 DIAGNOSIS — I5022 Chronic systolic (congestive) heart failure: Secondary | ICD-10-CM | POA: Diagnosis not present

## 2021-11-02 DIAGNOSIS — R0609 Other forms of dyspnea: Secondary | ICD-10-CM | POA: Diagnosis not present

## 2021-11-02 DIAGNOSIS — Z95810 Presence of automatic (implantable) cardiac defibrillator: Secondary | ICD-10-CM | POA: Diagnosis not present

## 2021-11-06 DIAGNOSIS — I420 Dilated cardiomyopathy: Secondary | ICD-10-CM | POA: Diagnosis not present

## 2021-11-06 DIAGNOSIS — Z4502 Encounter for adjustment and management of automatic implantable cardiac defibrillator: Secondary | ICD-10-CM | POA: Diagnosis not present

## 2021-11-06 DIAGNOSIS — I255 Ischemic cardiomyopathy: Secondary | ICD-10-CM | POA: Diagnosis not present

## 2021-11-07 DIAGNOSIS — I255 Ischemic cardiomyopathy: Secondary | ICD-10-CM | POA: Diagnosis not present

## 2021-11-07 DIAGNOSIS — I420 Dilated cardiomyopathy: Secondary | ICD-10-CM | POA: Diagnosis not present

## 2021-11-09 DIAGNOSIS — I5022 Chronic systolic (congestive) heart failure: Secondary | ICD-10-CM | POA: Diagnosis not present

## 2021-11-09 DIAGNOSIS — E611 Iron deficiency: Secondary | ICD-10-CM | POA: Diagnosis not present

## 2021-11-22 DIAGNOSIS — I10 Essential (primary) hypertension: Secondary | ICD-10-CM | POA: Diagnosis not present

## 2021-11-22 DIAGNOSIS — E785 Hyperlipidemia, unspecified: Secondary | ICD-10-CM | POA: Diagnosis not present

## 2021-11-22 DIAGNOSIS — E119 Type 2 diabetes mellitus without complications: Secondary | ICD-10-CM | POA: Diagnosis not present

## 2021-11-22 DIAGNOSIS — E039 Hypothyroidism, unspecified: Secondary | ICD-10-CM | POA: Diagnosis not present

## 2021-11-29 DIAGNOSIS — I420 Dilated cardiomyopathy: Secondary | ICD-10-CM | POA: Diagnosis not present

## 2021-11-29 DIAGNOSIS — I255 Ischemic cardiomyopathy: Secondary | ICD-10-CM | POA: Diagnosis not present

## 2021-12-01 DIAGNOSIS — E611 Iron deficiency: Secondary | ICD-10-CM | POA: Diagnosis not present

## 2022-01-11 DIAGNOSIS — Z95810 Presence of automatic (implantable) cardiac defibrillator: Secondary | ICD-10-CM | POA: Diagnosis not present

## 2022-01-11 DIAGNOSIS — I420 Dilated cardiomyopathy: Secondary | ICD-10-CM | POA: Diagnosis not present

## 2022-01-11 DIAGNOSIS — I255 Ischemic cardiomyopathy: Secondary | ICD-10-CM | POA: Diagnosis not present

## 2022-02-06 DIAGNOSIS — E119 Type 2 diabetes mellitus without complications: Secondary | ICD-10-CM | POA: Diagnosis not present

## 2022-02-06 DIAGNOSIS — Z7984 Long term (current) use of oral hypoglycemic drugs: Secondary | ICD-10-CM | POA: Diagnosis not present

## 2022-02-06 DIAGNOSIS — I110 Hypertensive heart disease with heart failure: Secondary | ICD-10-CM | POA: Diagnosis not present

## 2022-02-06 DIAGNOSIS — T82110A Breakdown (mechanical) of cardiac electrode, initial encounter: Secondary | ICD-10-CM | POA: Diagnosis not present

## 2022-02-06 DIAGNOSIS — I5022 Chronic systolic (congestive) heart failure: Secondary | ICD-10-CM | POA: Diagnosis not present

## 2022-02-06 DIAGNOSIS — Z955 Presence of coronary angioplasty implant and graft: Secondary | ICD-10-CM | POA: Diagnosis not present

## 2022-02-06 DIAGNOSIS — D696 Thrombocytopenia, unspecified: Secondary | ICD-10-CM | POA: Diagnosis not present

## 2022-02-06 DIAGNOSIS — Z95810 Presence of automatic (implantable) cardiac defibrillator: Secondary | ICD-10-CM | POA: Diagnosis not present

## 2022-02-06 DIAGNOSIS — Z87891 Personal history of nicotine dependence: Secondary | ICD-10-CM | POA: Diagnosis not present

## 2022-02-06 DIAGNOSIS — Z4502 Encounter for adjustment and management of automatic implantable cardiac defibrillator: Secondary | ICD-10-CM | POA: Diagnosis not present

## 2022-02-06 DIAGNOSIS — I252 Old myocardial infarction: Secondary | ICD-10-CM | POA: Diagnosis not present

## 2022-02-07 DIAGNOSIS — E119 Type 2 diabetes mellitus without complications: Secondary | ICD-10-CM | POA: Diagnosis not present

## 2022-02-07 DIAGNOSIS — Z950 Presence of cardiac pacemaker: Secondary | ICD-10-CM | POA: Diagnosis not present

## 2022-02-07 DIAGNOSIS — Z7984 Long term (current) use of oral hypoglycemic drugs: Secondary | ICD-10-CM | POA: Diagnosis not present

## 2022-02-07 DIAGNOSIS — I5022 Chronic systolic (congestive) heart failure: Secondary | ICD-10-CM | POA: Diagnosis not present

## 2022-02-07 DIAGNOSIS — Z4502 Encounter for adjustment and management of automatic implantable cardiac defibrillator: Secondary | ICD-10-CM | POA: Diagnosis not present

## 2022-02-07 DIAGNOSIS — Z95810 Presence of automatic (implantable) cardiac defibrillator: Secondary | ICD-10-CM | POA: Diagnosis not present

## 2022-02-07 DIAGNOSIS — I110 Hypertensive heart disease with heart failure: Secondary | ICD-10-CM | POA: Diagnosis not present

## 2022-02-07 DIAGNOSIS — D696 Thrombocytopenia, unspecified: Secondary | ICD-10-CM | POA: Diagnosis not present

## 2022-02-21 DIAGNOSIS — Z95810 Presence of automatic (implantable) cardiac defibrillator: Secondary | ICD-10-CM | POA: Diagnosis not present

## 2022-02-21 DIAGNOSIS — I5022 Chronic systolic (congestive) heart failure: Secondary | ICD-10-CM | POA: Diagnosis not present

## 2022-02-22 DIAGNOSIS — I5022 Chronic systolic (congestive) heart failure: Secondary | ICD-10-CM | POA: Diagnosis not present

## 2022-02-23 DIAGNOSIS — Z23 Encounter for immunization: Secondary | ICD-10-CM | POA: Diagnosis not present

## 2022-02-27 DIAGNOSIS — Z23 Encounter for immunization: Secondary | ICD-10-CM | POA: Diagnosis not present

## 2022-02-28 DIAGNOSIS — D693 Immune thrombocytopenic purpura: Secondary | ICD-10-CM | POA: Diagnosis not present

## 2022-03-09 DIAGNOSIS — I5022 Chronic systolic (congestive) heart failure: Secondary | ICD-10-CM | POA: Diagnosis not present

## 2022-04-16 DIAGNOSIS — D693 Immune thrombocytopenic purpura: Secondary | ICD-10-CM | POA: Diagnosis not present

## 2022-04-16 DIAGNOSIS — E611 Iron deficiency: Secondary | ICD-10-CM | POA: Diagnosis not present

## 2022-04-24 DIAGNOSIS — E119 Type 2 diabetes mellitus without complications: Secondary | ICD-10-CM | POA: Diagnosis not present

## 2022-04-24 DIAGNOSIS — I252 Old myocardial infarction: Secondary | ICD-10-CM | POA: Diagnosis not present

## 2022-04-24 DIAGNOSIS — E032 Hypothyroidism due to medicaments and other exogenous substances: Secondary | ICD-10-CM | POA: Diagnosis not present

## 2022-04-24 DIAGNOSIS — Z95810 Presence of automatic (implantable) cardiac defibrillator: Secondary | ICD-10-CM | POA: Diagnosis not present

## 2022-04-24 DIAGNOSIS — I479 Paroxysmal tachycardia, unspecified: Secondary | ICD-10-CM | POA: Diagnosis not present

## 2022-04-24 DIAGNOSIS — I5022 Chronic systolic (congestive) heart failure: Secondary | ICD-10-CM | POA: Diagnosis not present

## 2022-04-24 DIAGNOSIS — R7989 Other specified abnormal findings of blood chemistry: Secondary | ICD-10-CM | POA: Diagnosis not present

## 2022-04-24 DIAGNOSIS — R002 Palpitations: Secondary | ICD-10-CM | POA: Diagnosis not present

## 2022-04-24 DIAGNOSIS — I2510 Atherosclerotic heart disease of native coronary artery without angina pectoris: Secondary | ICD-10-CM | POA: Diagnosis not present

## 2022-04-24 DIAGNOSIS — E785 Hyperlipidemia, unspecified: Secondary | ICD-10-CM | POA: Diagnosis not present

## 2022-04-24 DIAGNOSIS — I110 Hypertensive heart disease with heart failure: Secondary | ICD-10-CM | POA: Diagnosis not present

## 2022-04-24 DIAGNOSIS — E079 Disorder of thyroid, unspecified: Secondary | ICD-10-CM | POA: Diagnosis not present

## 2022-04-24 DIAGNOSIS — I255 Ischemic cardiomyopathy: Secondary | ICD-10-CM | POA: Diagnosis not present

## 2022-04-24 DIAGNOSIS — I420 Dilated cardiomyopathy: Secondary | ICD-10-CM | POA: Diagnosis not present

## 2022-04-25 DIAGNOSIS — R002 Palpitations: Secondary | ICD-10-CM | POA: Diagnosis not present

## 2022-05-03 DIAGNOSIS — Z87891 Personal history of nicotine dependence: Secondary | ICD-10-CM | POA: Diagnosis not present

## 2022-05-03 DIAGNOSIS — I420 Dilated cardiomyopathy: Secondary | ICD-10-CM | POA: Diagnosis not present

## 2022-05-03 DIAGNOSIS — I110 Hypertensive heart disease with heart failure: Secondary | ICD-10-CM | POA: Diagnosis not present

## 2022-05-03 DIAGNOSIS — I509 Heart failure, unspecified: Secondary | ICD-10-CM | POA: Diagnosis not present

## 2022-05-03 DIAGNOSIS — I447 Left bundle-branch block, unspecified: Secondary | ICD-10-CM | POA: Diagnosis not present

## 2022-05-03 DIAGNOSIS — Z4502 Encounter for adjustment and management of automatic implantable cardiac defibrillator: Secondary | ICD-10-CM | POA: Diagnosis not present

## 2022-05-03 DIAGNOSIS — T82110A Breakdown (mechanical) of cardiac electrode, initial encounter: Secondary | ICD-10-CM | POA: Diagnosis not present

## 2022-05-03 DIAGNOSIS — J9811 Atelectasis: Secondary | ICD-10-CM | POA: Diagnosis not present

## 2022-05-03 DIAGNOSIS — I25119 Atherosclerotic heart disease of native coronary artery with unspecified angina pectoris: Secondary | ICD-10-CM | POA: Diagnosis not present

## 2022-05-03 DIAGNOSIS — E119 Type 2 diabetes mellitus without complications: Secondary | ICD-10-CM | POA: Diagnosis not present

## 2022-05-03 DIAGNOSIS — I252 Old myocardial infarction: Secondary | ICD-10-CM | POA: Diagnosis not present

## 2022-05-03 DIAGNOSIS — I255 Ischemic cardiomyopathy: Secondary | ICD-10-CM | POA: Diagnosis not present

## 2022-05-03 DIAGNOSIS — Z79899 Other long term (current) drug therapy: Secondary | ICD-10-CM | POA: Diagnosis not present

## 2022-05-03 DIAGNOSIS — Z7984 Long term (current) use of oral hypoglycemic drugs: Secondary | ICD-10-CM | POA: Diagnosis not present

## 2022-05-03 DIAGNOSIS — Z955 Presence of coronary angioplasty implant and graft: Secondary | ICD-10-CM | POA: Diagnosis not present

## 2022-05-03 DIAGNOSIS — T82190A Other mechanical complication of cardiac electrode, initial encounter: Secondary | ICD-10-CM | POA: Diagnosis not present

## 2022-05-04 DIAGNOSIS — Z950 Presence of cardiac pacemaker: Secondary | ICD-10-CM | POA: Diagnosis not present

## 2022-05-04 DIAGNOSIS — I447 Left bundle-branch block, unspecified: Secondary | ICD-10-CM | POA: Diagnosis not present

## 2022-05-04 DIAGNOSIS — I420 Dilated cardiomyopathy: Secondary | ICD-10-CM | POA: Diagnosis not present

## 2022-05-04 DIAGNOSIS — I498 Other specified cardiac arrhythmias: Secondary | ICD-10-CM | POA: Diagnosis not present

## 2022-05-04 DIAGNOSIS — I25119 Atherosclerotic heart disease of native coronary artery with unspecified angina pectoris: Secondary | ICD-10-CM | POA: Diagnosis not present

## 2022-05-04 DIAGNOSIS — E119 Type 2 diabetes mellitus without complications: Secondary | ICD-10-CM | POA: Diagnosis not present

## 2022-05-04 DIAGNOSIS — Z4502 Encounter for adjustment and management of automatic implantable cardiac defibrillator: Secondary | ICD-10-CM | POA: Diagnosis not present

## 2022-05-04 DIAGNOSIS — I255 Ischemic cardiomyopathy: Secondary | ICD-10-CM | POA: Diagnosis not present

## 2022-05-08 DIAGNOSIS — I5022 Chronic systolic (congestive) heart failure: Secondary | ICD-10-CM | POA: Diagnosis not present

## 2022-05-08 DIAGNOSIS — Z95810 Presence of automatic (implantable) cardiac defibrillator: Secondary | ICD-10-CM | POA: Diagnosis not present

## 2022-05-17 DIAGNOSIS — Z4502 Encounter for adjustment and management of automatic implantable cardiac defibrillator: Secondary | ICD-10-CM | POA: Diagnosis not present

## 2022-05-23 DIAGNOSIS — R490 Dysphonia: Secondary | ICD-10-CM | POA: Insufficient documentation

## 2022-05-23 DIAGNOSIS — H6123 Impacted cerumen, bilateral: Secondary | ICD-10-CM | POA: Diagnosis not present

## 2022-05-23 DIAGNOSIS — H903 Sensorineural hearing loss, bilateral: Secondary | ICD-10-CM | POA: Diagnosis not present

## 2022-05-25 DIAGNOSIS — Z87891 Personal history of nicotine dependence: Secondary | ICD-10-CM | POA: Diagnosis not present

## 2022-05-25 DIAGNOSIS — E1165 Type 2 diabetes mellitus with hyperglycemia: Secondary | ICD-10-CM | POA: Diagnosis not present

## 2022-05-25 DIAGNOSIS — Z7984 Long term (current) use of oral hypoglycemic drugs: Secondary | ICD-10-CM | POA: Diagnosis not present

## 2022-05-25 DIAGNOSIS — I10 Essential (primary) hypertension: Secondary | ICD-10-CM | POA: Diagnosis not present

## 2022-05-25 DIAGNOSIS — E039 Hypothyroidism, unspecified: Secondary | ICD-10-CM | POA: Diagnosis not present

## 2022-05-25 DIAGNOSIS — E785 Hyperlipidemia, unspecified: Secondary | ICD-10-CM | POA: Diagnosis not present

## 2022-06-01 DIAGNOSIS — E611 Iron deficiency: Secondary | ICD-10-CM | POA: Diagnosis not present

## 2022-06-01 DIAGNOSIS — D693 Immune thrombocytopenic purpura: Secondary | ICD-10-CM | POA: Diagnosis not present

## 2022-06-11 DIAGNOSIS — Z7984 Long term (current) use of oral hypoglycemic drugs: Secondary | ICD-10-CM | POA: Diagnosis not present

## 2022-06-11 DIAGNOSIS — Z20822 Contact with and (suspected) exposure to covid-19: Secondary | ICD-10-CM | POA: Diagnosis not present

## 2022-06-11 DIAGNOSIS — R0789 Other chest pain: Secondary | ICD-10-CM | POA: Diagnosis not present

## 2022-06-11 DIAGNOSIS — E1165 Type 2 diabetes mellitus with hyperglycemia: Secondary | ICD-10-CM | POA: Diagnosis not present

## 2022-06-11 DIAGNOSIS — R079 Chest pain, unspecified: Secondary | ICD-10-CM | POA: Diagnosis not present

## 2022-06-11 DIAGNOSIS — Z950 Presence of cardiac pacemaker: Secondary | ICD-10-CM | POA: Diagnosis not present

## 2022-06-11 DIAGNOSIS — R0602 Shortness of breath: Secondary | ICD-10-CM | POA: Diagnosis not present

## 2022-06-15 DIAGNOSIS — E079 Disorder of thyroid, unspecified: Secondary | ICD-10-CM | POA: Diagnosis not present

## 2022-06-15 DIAGNOSIS — Z95810 Presence of automatic (implantable) cardiac defibrillator: Secondary | ICD-10-CM | POA: Diagnosis not present

## 2022-06-15 DIAGNOSIS — R0602 Shortness of breath: Secondary | ICD-10-CM | POA: Diagnosis not present

## 2022-06-15 DIAGNOSIS — R531 Weakness: Secondary | ICD-10-CM | POA: Diagnosis not present

## 2022-06-15 DIAGNOSIS — I255 Ischemic cardiomyopathy: Secondary | ICD-10-CM | POA: Diagnosis not present

## 2022-06-15 DIAGNOSIS — I252 Old myocardial infarction: Secondary | ICD-10-CM | POA: Diagnosis not present

## 2022-06-15 DIAGNOSIS — J90 Pleural effusion, not elsewhere classified: Secondary | ICD-10-CM | POA: Diagnosis not present

## 2022-06-15 DIAGNOSIS — I2510 Atherosclerotic heart disease of native coronary artery without angina pectoris: Secondary | ICD-10-CM | POA: Diagnosis not present

## 2022-06-15 DIAGNOSIS — I420 Dilated cardiomyopathy: Secondary | ICD-10-CM | POA: Diagnosis not present

## 2022-06-15 DIAGNOSIS — R079 Chest pain, unspecified: Secondary | ICD-10-CM | POA: Diagnosis not present

## 2022-06-15 DIAGNOSIS — E039 Hypothyroidism, unspecified: Secondary | ICD-10-CM | POA: Diagnosis not present

## 2022-06-18 DIAGNOSIS — D7589 Other specified diseases of blood and blood-forming organs: Secondary | ICD-10-CM | POA: Diagnosis not present

## 2022-06-20 DIAGNOSIS — D696 Thrombocytopenia, unspecified: Secondary | ICD-10-CM | POA: Diagnosis not present

## 2022-06-20 DIAGNOSIS — I5022 Chronic systolic (congestive) heart failure: Secondary | ICD-10-CM | POA: Diagnosis not present

## 2022-06-20 DIAGNOSIS — D693 Immune thrombocytopenic purpura: Secondary | ICD-10-CM | POA: Diagnosis not present

## 2022-06-20 DIAGNOSIS — E611 Iron deficiency: Secondary | ICD-10-CM | POA: Diagnosis not present

## 2022-06-20 DIAGNOSIS — D638 Anemia in other chronic diseases classified elsewhere: Secondary | ICD-10-CM | POA: Diagnosis not present

## 2022-06-21 DIAGNOSIS — Z1152 Encounter for screening for COVID-19: Secondary | ICD-10-CM | POA: Diagnosis not present

## 2022-06-21 DIAGNOSIS — I2510 Atherosclerotic heart disease of native coronary artery without angina pectoris: Secondary | ICD-10-CM | POA: Diagnosis not present

## 2022-06-21 DIAGNOSIS — J440 Chronic obstructive pulmonary disease with acute lower respiratory infection: Secondary | ICD-10-CM | POA: Diagnosis not present

## 2022-06-21 DIAGNOSIS — I429 Cardiomyopathy, unspecified: Secondary | ICD-10-CM | POA: Diagnosis not present

## 2022-06-21 DIAGNOSIS — I252 Old myocardial infarction: Secondary | ICD-10-CM | POA: Diagnosis not present

## 2022-06-21 DIAGNOSIS — Z95810 Presence of automatic (implantable) cardiac defibrillator: Secondary | ICD-10-CM | POA: Diagnosis not present

## 2022-06-21 DIAGNOSIS — R0902 Hypoxemia: Secondary | ICD-10-CM | POA: Diagnosis not present

## 2022-06-21 DIAGNOSIS — Z91013 Allergy to seafood: Secondary | ICD-10-CM | POA: Diagnosis not present

## 2022-06-21 DIAGNOSIS — I255 Ischemic cardiomyopathy: Secondary | ICD-10-CM | POA: Diagnosis not present

## 2022-06-21 DIAGNOSIS — E039 Hypothyroidism, unspecified: Secondary | ICD-10-CM | POA: Diagnosis not present

## 2022-06-21 DIAGNOSIS — Z7952 Long term (current) use of systemic steroids: Secondary | ICD-10-CM | POA: Diagnosis not present

## 2022-06-21 DIAGNOSIS — I110 Hypertensive heart disease with heart failure: Secondary | ICD-10-CM | POA: Diagnosis not present

## 2022-06-21 DIAGNOSIS — J9601 Acute respiratory failure with hypoxia: Secondary | ICD-10-CM | POA: Diagnosis not present

## 2022-06-21 DIAGNOSIS — Z79899 Other long term (current) drug therapy: Secondary | ICD-10-CM | POA: Diagnosis not present

## 2022-06-21 DIAGNOSIS — J189 Pneumonia, unspecified organism: Secondary | ICD-10-CM | POA: Diagnosis not present

## 2022-06-21 DIAGNOSIS — Z7984 Long term (current) use of oral hypoglycemic drugs: Secondary | ICD-10-CM | POA: Diagnosis not present

## 2022-06-21 DIAGNOSIS — Z792 Long term (current) use of antibiotics: Secondary | ICD-10-CM | POA: Diagnosis not present

## 2022-06-21 DIAGNOSIS — I10 Essential (primary) hypertension: Secondary | ICD-10-CM | POA: Diagnosis not present

## 2022-06-21 DIAGNOSIS — I5022 Chronic systolic (congestive) heart failure: Secondary | ICD-10-CM | POA: Diagnosis not present

## 2022-06-21 DIAGNOSIS — I509 Heart failure, unspecified: Secondary | ICD-10-CM | POA: Diagnosis not present

## 2022-06-21 DIAGNOSIS — R0602 Shortness of breath: Secondary | ICD-10-CM | POA: Diagnosis not present

## 2022-06-21 DIAGNOSIS — R778 Other specified abnormalities of plasma proteins: Secondary | ICD-10-CM | POA: Diagnosis not present

## 2022-06-22 DIAGNOSIS — I255 Ischemic cardiomyopathy: Secondary | ICD-10-CM | POA: Diagnosis not present

## 2022-06-22 DIAGNOSIS — J9601 Acute respiratory failure with hypoxia: Secondary | ICD-10-CM | POA: Diagnosis not present

## 2022-06-22 DIAGNOSIS — Z95810 Presence of automatic (implantable) cardiac defibrillator: Secondary | ICD-10-CM | POA: Diagnosis not present

## 2022-06-22 DIAGNOSIS — I509 Heart failure, unspecified: Secondary | ICD-10-CM | POA: Diagnosis not present

## 2022-07-02 DIAGNOSIS — Z09 Encounter for follow-up examination after completed treatment for conditions other than malignant neoplasm: Secondary | ICD-10-CM | POA: Diagnosis not present

## 2022-07-02 DIAGNOSIS — Z0000 Encounter for general adult medical examination without abnormal findings: Secondary | ICD-10-CM | POA: Diagnosis not present

## 2022-07-02 DIAGNOSIS — Z1331 Encounter for screening for depression: Secondary | ICD-10-CM | POA: Diagnosis not present

## 2022-07-02 DIAGNOSIS — Z6821 Body mass index (BMI) 21.0-21.9, adult: Secondary | ICD-10-CM | POA: Diagnosis not present

## 2022-07-04 DIAGNOSIS — I255 Ischemic cardiomyopathy: Secondary | ICD-10-CM | POA: Diagnosis not present

## 2022-07-04 DIAGNOSIS — Z95810 Presence of automatic (implantable) cardiac defibrillator: Secondary | ICD-10-CM | POA: Diagnosis not present

## 2022-07-11 DIAGNOSIS — D696 Thrombocytopenia, unspecified: Secondary | ICD-10-CM | POA: Diagnosis not present

## 2022-07-11 DIAGNOSIS — D6109 Other constitutional aplastic anemia: Secondary | ICD-10-CM | POA: Diagnosis not present

## 2022-07-17 DIAGNOSIS — Z95810 Presence of automatic (implantable) cardiac defibrillator: Secondary | ICD-10-CM | POA: Diagnosis not present

## 2022-07-17 DIAGNOSIS — I252 Old myocardial infarction: Secondary | ICD-10-CM | POA: Diagnosis not present

## 2022-07-17 DIAGNOSIS — I2510 Atherosclerotic heart disease of native coronary artery without angina pectoris: Secondary | ICD-10-CM | POA: Diagnosis not present

## 2022-07-17 DIAGNOSIS — I10 Essential (primary) hypertension: Secondary | ICD-10-CM | POA: Diagnosis not present

## 2022-07-17 DIAGNOSIS — E785 Hyperlipidemia, unspecified: Secondary | ICD-10-CM | POA: Diagnosis not present

## 2022-07-17 DIAGNOSIS — I110 Hypertensive heart disease with heart failure: Secondary | ICD-10-CM | POA: Diagnosis not present

## 2022-07-17 DIAGNOSIS — E782 Mixed hyperlipidemia: Secondary | ICD-10-CM | POA: Diagnosis not present

## 2022-07-17 DIAGNOSIS — I5022 Chronic systolic (congestive) heart failure: Secondary | ICD-10-CM | POA: Diagnosis not present

## 2022-07-18 DIAGNOSIS — J9601 Acute respiratory failure with hypoxia: Secondary | ICD-10-CM | POA: Diagnosis not present

## 2022-07-18 DIAGNOSIS — D693 Immune thrombocytopenic purpura: Secondary | ICD-10-CM | POA: Diagnosis not present

## 2022-07-18 DIAGNOSIS — E875 Hyperkalemia: Secondary | ICD-10-CM | POA: Diagnosis not present

## 2022-07-27 DIAGNOSIS — I517 Cardiomegaly: Secondary | ICD-10-CM | POA: Diagnosis not present

## 2022-08-06 DIAGNOSIS — I5022 Chronic systolic (congestive) heart failure: Secondary | ICD-10-CM | POA: Diagnosis not present

## 2022-08-06 DIAGNOSIS — Z95810 Presence of automatic (implantable) cardiac defibrillator: Secondary | ICD-10-CM | POA: Diagnosis not present

## 2022-08-06 DIAGNOSIS — I420 Dilated cardiomyopathy: Secondary | ICD-10-CM | POA: Diagnosis not present

## 2022-08-06 DIAGNOSIS — I2510 Atherosclerotic heart disease of native coronary artery without angina pectoris: Secondary | ICD-10-CM | POA: Diagnosis not present

## 2022-08-06 DIAGNOSIS — Z955 Presence of coronary angioplasty implant and graft: Secondary | ICD-10-CM | POA: Diagnosis not present

## 2022-08-06 DIAGNOSIS — R079 Chest pain, unspecified: Secondary | ICD-10-CM | POA: Diagnosis not present

## 2022-08-06 DIAGNOSIS — I110 Hypertensive heart disease with heart failure: Secondary | ICD-10-CM | POA: Diagnosis not present

## 2022-08-06 DIAGNOSIS — I255 Ischemic cardiomyopathy: Secondary | ICD-10-CM | POA: Diagnosis not present

## 2022-08-06 DIAGNOSIS — E119 Type 2 diabetes mellitus without complications: Secondary | ICD-10-CM | POA: Diagnosis not present

## 2022-08-06 DIAGNOSIS — I252 Old myocardial infarction: Secondary | ICD-10-CM | POA: Diagnosis not present

## 2022-08-06 DIAGNOSIS — E079 Disorder of thyroid, unspecified: Secondary | ICD-10-CM | POA: Diagnosis not present

## 2022-08-09 DIAGNOSIS — N5089 Other specified disorders of the male genital organs: Secondary | ICD-10-CM | POA: Diagnosis not present

## 2022-08-09 DIAGNOSIS — Z6820 Body mass index (BMI) 20.0-20.9, adult: Secondary | ICD-10-CM | POA: Diagnosis not present

## 2022-08-09 DIAGNOSIS — N481 Balanitis: Secondary | ICD-10-CM | POA: Diagnosis not present

## 2022-08-10 DIAGNOSIS — N433 Hydrocele, unspecified: Secondary | ICD-10-CM | POA: Diagnosis not present

## 2022-08-10 DIAGNOSIS — N5089 Other specified disorders of the male genital organs: Secondary | ICD-10-CM | POA: Diagnosis not present

## 2022-08-10 DIAGNOSIS — I861 Scrotal varices: Secondary | ICD-10-CM | POA: Diagnosis not present

## 2022-08-14 DIAGNOSIS — D693 Immune thrombocytopenic purpura: Secondary | ICD-10-CM | POA: Diagnosis not present

## 2022-08-16 IMAGING — MR MR HEAD W/O CM
9 of 10 series · 36 of 48 positions shown · non-contrast
Comparison: None.

CLINICAL DATA: TIA.

EXAM:
MRI HEAD WITHOUT CONTRAST
TECHNIQUE: Multiplanar, multiecho pulse sequences of the brain and surrounding
structures were obtained without intravenous contrast.

[Series 3: DWI · axial · 3.0mm · 1.09mm/px · z∈[-32,+119]mm · 8 of 104 slices shown (1 of 4)]
[im 1/104]
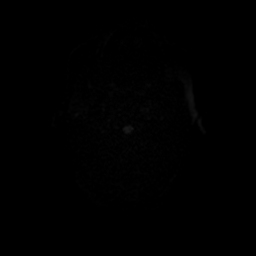
[im 12/104]
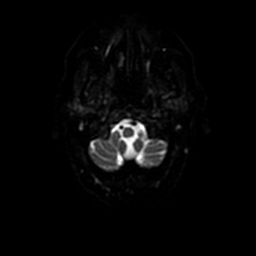
[im 35/104]
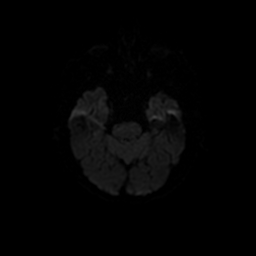
[im 46/104]
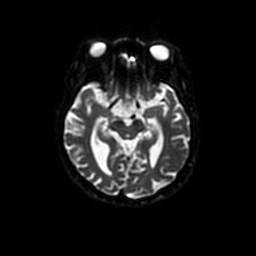
[im 58/104]
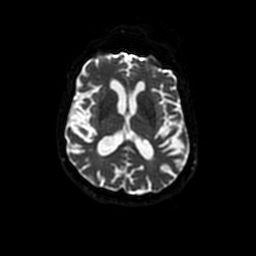
[im 69/104]
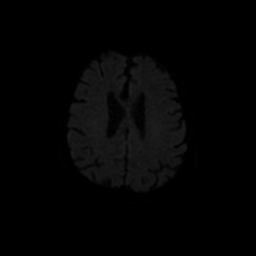
[im 92/104]
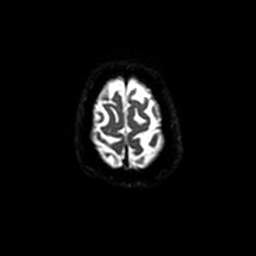
[im 104/104]
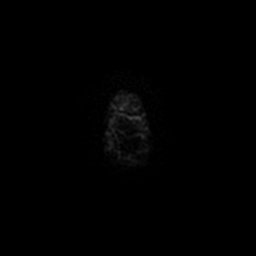

[Series 4: DWI · coronal · 5.0mm · 1.09mm/px · 7 of 76 slices shown (2 of 4)]
[im 1/76]
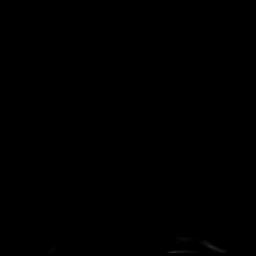
[im 13/76]
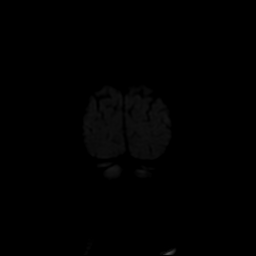
[im 26/76]
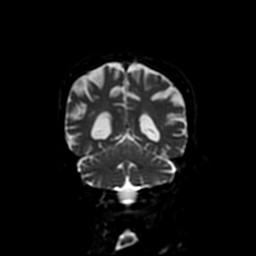
[im 38/76]
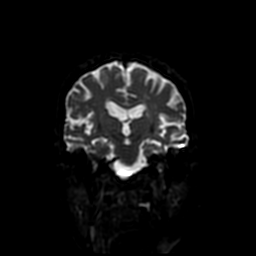
[im 51/76]
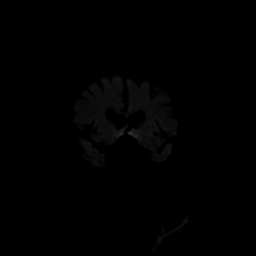
[im 63/76]
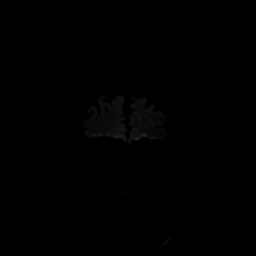
[im 76/76]
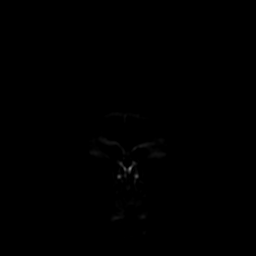

[Series 5: T1 · sagittal · 5.0mm · 0.47mm/px · 2 of 23 slices shown (1 of 2)]
[im 1/23]
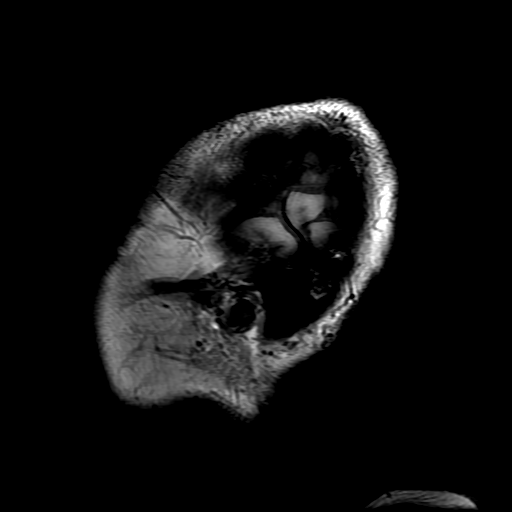
[im 23/23]
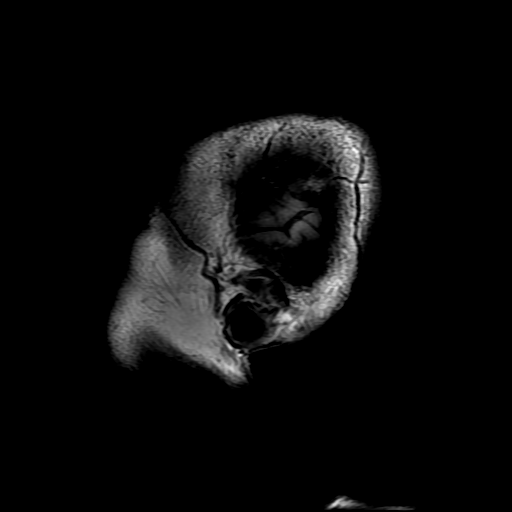

[Series 6: T2 · axial · 5.0mm · 0.43mm/px · z∈[-48,+100]mm · 3 of 26 slices shown (1 of 2)]
[im 1/26]
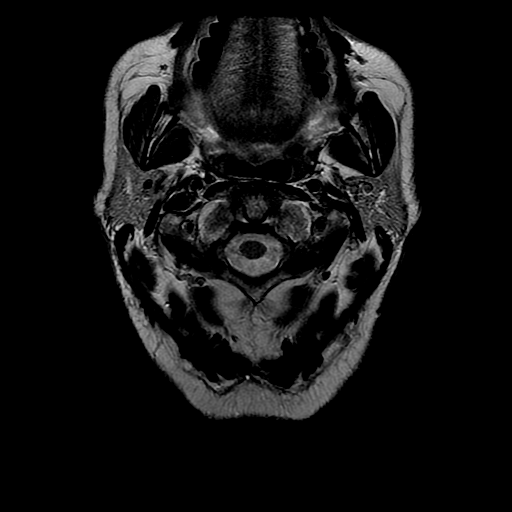
[im 13/26]
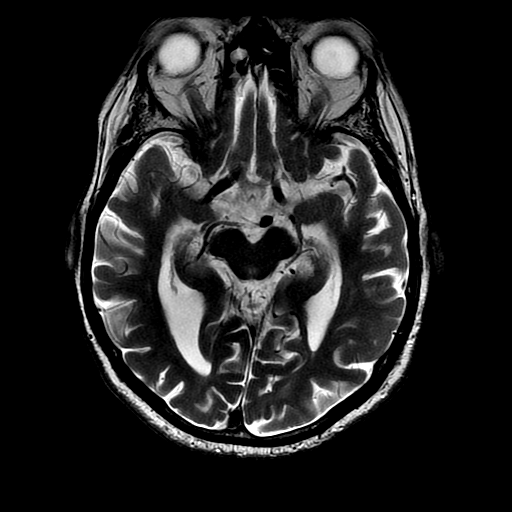
[im 26/26]
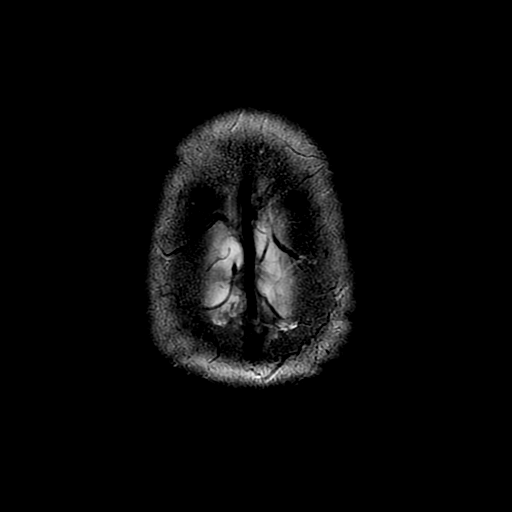

[Series 7: FLAIR · axial · 3.0mm · 0.43mm/px · z∈[-48,+100]mm · 3 of 26 slices shown]
[im 1/26]
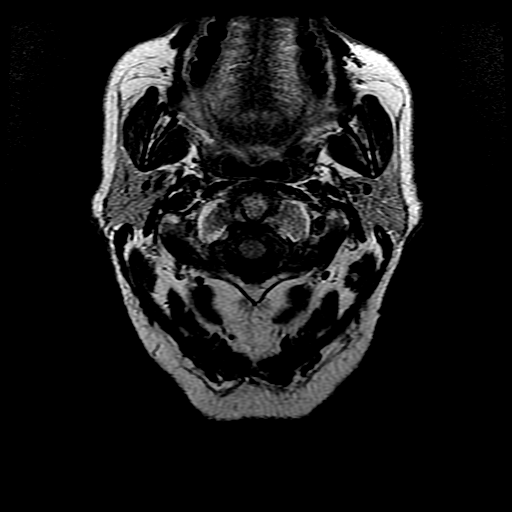
[im 13/26]
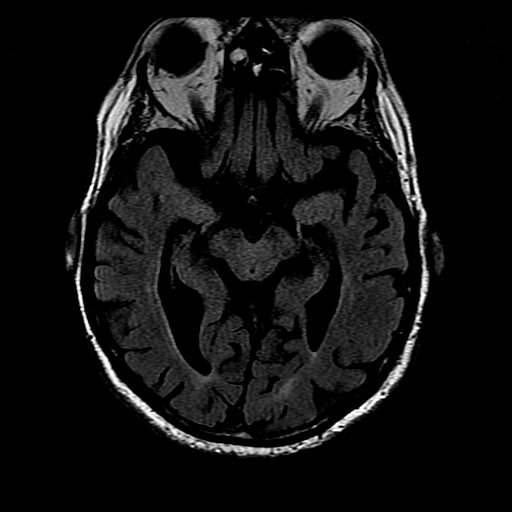
[im 26/26]
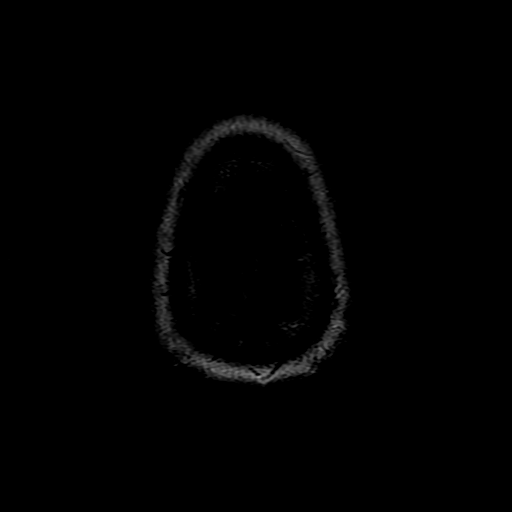

[Series 9: T1 · axial · 3.0mm · 0.43mm/px · z∈[-50,-34]mm · 2 of 104 slices shown (2 of 2)]
[im 1/104]
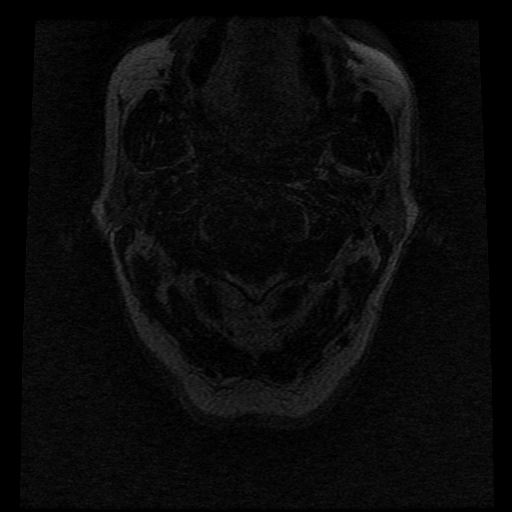
[im 12/104]
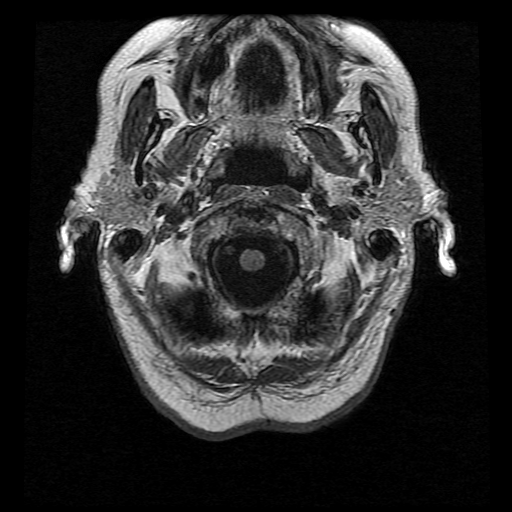

[Series 10: T2 · coronal · 5.0mm · 0.39mm/px · 2 of 25 slices shown (2 of 2)]
[im 1/25]
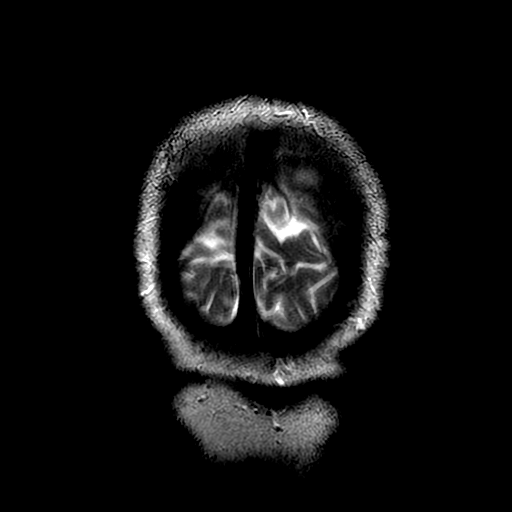
[im 25/25]
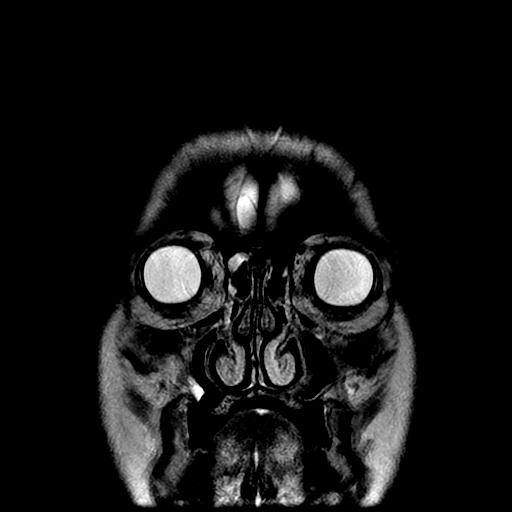

[Series 300: DWI · axial · 3.0mm · 1.09mm/px · z∈[-32,+119]mm · 5 of 52 slices shown (3 of 4)]
[im 1/52]
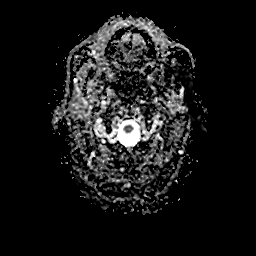
[im 13/52]
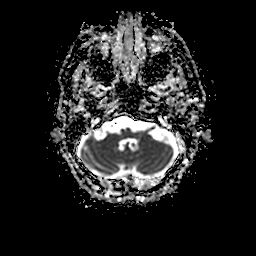
[im 26/52]
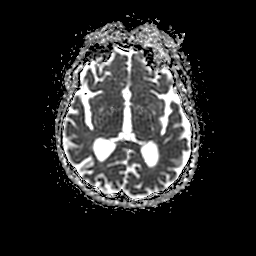
[im 39/52]
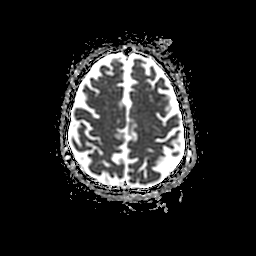
[im 52/52]
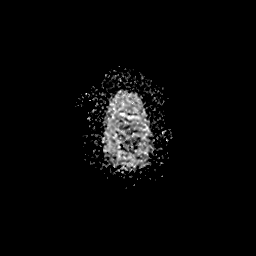

[Series 400: DWI · coronal · 5.0mm · 1.09mm/px · 4 of 38 slices shown (4 of 4)]
[im 1/38]
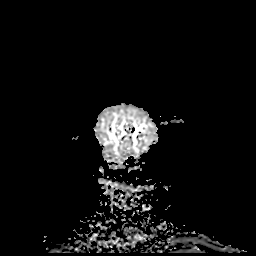
[im 13/38]
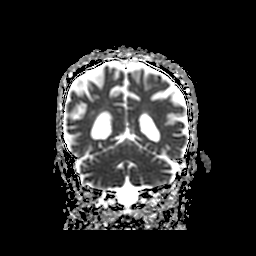
[im 25/38]
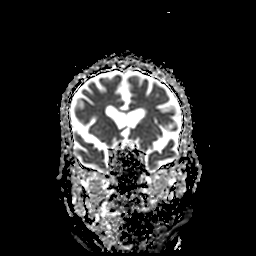
[im 38/38]
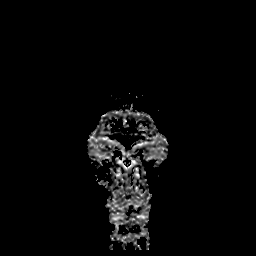

[36 of 48 positions shown; findings below may reference images not displayed]

FINDINGS: Brain: No acute infarction, hemorrhage, hydrocephalus, extra-axial
collection or mass lesion. Mild for age T2/FLAIR hyperintensities
within the white matter, compatible with chronic microvascular
ischemic disease. Moderate cerebral atrophy with ex vacuo
ventricular dilation.

Vascular: Major arterial flow voids are maintained at the skull
base.

Skull and upper cervical spine: Normal marrow signal.

Sinuses/Orbits: Mild ethmoid air cell mucosal thickening without
air-fluid levels. Unremarkable orbits.

Other: Trace right mastoid effusion.
IMPRESSION: 1. No evidence of acute intracranial abnormality. Specifically, no
acute infarct.
2. Mild chronic microvascular disease and moderate atrophy.

## 2022-08-22 DIAGNOSIS — I509 Heart failure, unspecified: Secondary | ICD-10-CM | POA: Diagnosis not present

## 2022-08-22 DIAGNOSIS — E1165 Type 2 diabetes mellitus with hyperglycemia: Secondary | ICD-10-CM | POA: Diagnosis not present

## 2022-08-22 DIAGNOSIS — I252 Old myocardial infarction: Secondary | ICD-10-CM | POA: Diagnosis not present

## 2022-08-22 DIAGNOSIS — Z955 Presence of coronary angioplasty implant and graft: Secondary | ICD-10-CM | POA: Diagnosis not present

## 2022-08-22 DIAGNOSIS — E039 Hypothyroidism, unspecified: Secondary | ICD-10-CM | POA: Diagnosis not present

## 2022-08-22 DIAGNOSIS — I213 ST elevation (STEMI) myocardial infarction of unspecified site: Secondary | ICD-10-CM | POA: Diagnosis not present

## 2022-08-22 DIAGNOSIS — R079 Chest pain, unspecified: Secondary | ICD-10-CM | POA: Diagnosis not present

## 2022-08-22 DIAGNOSIS — R000 Tachycardia, unspecified: Secondary | ICD-10-CM | POA: Diagnosis not present

## 2022-08-22 DIAGNOSIS — N401 Enlarged prostate with lower urinary tract symptoms: Secondary | ICD-10-CM | POA: Diagnosis not present

## 2022-08-22 DIAGNOSIS — R7989 Other specified abnormal findings of blood chemistry: Secondary | ICD-10-CM | POA: Diagnosis not present

## 2022-08-22 DIAGNOSIS — I130 Hypertensive heart and chronic kidney disease with heart failure and stage 1 through stage 4 chronic kidney disease, or unspecified chronic kidney disease: Secondary | ICD-10-CM | POA: Diagnosis not present

## 2022-08-22 DIAGNOSIS — J9601 Acute respiratory failure with hypoxia: Secondary | ICD-10-CM | POA: Diagnosis not present

## 2022-08-22 DIAGNOSIS — J810 Acute pulmonary edema: Secondary | ICD-10-CM | POA: Diagnosis not present

## 2022-08-22 DIAGNOSIS — Z87891 Personal history of nicotine dependence: Secondary | ICD-10-CM | POA: Diagnosis not present

## 2022-08-22 DIAGNOSIS — I5043 Acute on chronic combined systolic (congestive) and diastolic (congestive) heart failure: Secondary | ICD-10-CM | POA: Diagnosis not present

## 2022-08-22 DIAGNOSIS — I471 Supraventricular tachycardia, unspecified: Secondary | ICD-10-CM | POA: Diagnosis not present

## 2022-08-22 DIAGNOSIS — Z7984 Long term (current) use of oral hypoglycemic drugs: Secondary | ICD-10-CM | POA: Diagnosis not present

## 2022-08-22 DIAGNOSIS — E785 Hyperlipidemia, unspecified: Secondary | ICD-10-CM | POA: Diagnosis not present

## 2022-08-22 DIAGNOSIS — R0789 Other chest pain: Secondary | ICD-10-CM | POA: Diagnosis not present

## 2022-08-22 DIAGNOSIS — Z95810 Presence of automatic (implantable) cardiac defibrillator: Secondary | ICD-10-CM | POA: Diagnosis not present

## 2022-08-22 DIAGNOSIS — N1831 Chronic kidney disease, stage 3a: Secondary | ICD-10-CM | POA: Diagnosis not present

## 2022-08-22 DIAGNOSIS — Z79899 Other long term (current) drug therapy: Secondary | ICD-10-CM | POA: Diagnosis not present

## 2022-08-22 DIAGNOSIS — E8779 Other fluid overload: Secondary | ICD-10-CM | POA: Diagnosis not present

## 2022-08-22 DIAGNOSIS — E1122 Type 2 diabetes mellitus with diabetic chronic kidney disease: Secondary | ICD-10-CM | POA: Diagnosis not present

## 2022-08-22 DIAGNOSIS — J9602 Acute respiratory failure with hypercapnia: Secondary | ICD-10-CM | POA: Diagnosis not present

## 2022-08-22 DIAGNOSIS — R0902 Hypoxemia: Secondary | ICD-10-CM | POA: Diagnosis not present

## 2022-08-22 DIAGNOSIS — I2510 Atherosclerotic heart disease of native coronary artery without angina pectoris: Secondary | ICD-10-CM | POA: Diagnosis not present

## 2022-08-22 DIAGNOSIS — I255 Ischemic cardiomyopathy: Secondary | ICD-10-CM | POA: Diagnosis not present

## 2022-08-22 DIAGNOSIS — R062 Wheezing: Secondary | ICD-10-CM | POA: Diagnosis not present

## 2022-08-22 DIAGNOSIS — R350 Frequency of micturition: Secondary | ICD-10-CM | POA: Diagnosis not present

## 2022-08-22 DIAGNOSIS — R0602 Shortness of breath: Secondary | ICD-10-CM | POA: Diagnosis not present

## 2022-08-22 DIAGNOSIS — Z79890 Hormone replacement therapy (postmenopausal): Secondary | ICD-10-CM | POA: Diagnosis not present

## 2022-08-22 DIAGNOSIS — D696 Thrombocytopenia, unspecified: Secondary | ICD-10-CM | POA: Diagnosis not present

## 2022-08-22 DIAGNOSIS — D693 Immune thrombocytopenic purpura: Secondary | ICD-10-CM | POA: Diagnosis not present

## 2022-08-22 DIAGNOSIS — E119 Type 2 diabetes mellitus without complications: Secondary | ICD-10-CM | POA: Diagnosis not present

## 2022-08-22 DIAGNOSIS — E875 Hyperkalemia: Secondary | ICD-10-CM | POA: Diagnosis not present

## 2022-08-22 DIAGNOSIS — R0603 Acute respiratory distress: Secondary | ICD-10-CM | POA: Diagnosis not present

## 2022-08-23 DIAGNOSIS — R0603 Acute respiratory distress: Secondary | ICD-10-CM | POA: Diagnosis not present

## 2022-08-24 DIAGNOSIS — R0603 Acute respiratory distress: Secondary | ICD-10-CM | POA: Diagnosis not present

## 2022-08-26 DIAGNOSIS — I5043 Acute on chronic combined systolic (congestive) and diastolic (congestive) heart failure: Secondary | ICD-10-CM | POA: Diagnosis not present

## 2022-08-27 DIAGNOSIS — Z7984 Long term (current) use of oral hypoglycemic drugs: Secondary | ICD-10-CM | POA: Diagnosis not present

## 2022-08-27 DIAGNOSIS — E119 Type 2 diabetes mellitus without complications: Secondary | ICD-10-CM | POA: Diagnosis not present

## 2022-08-27 DIAGNOSIS — I10 Essential (primary) hypertension: Secondary | ICD-10-CM | POA: Diagnosis not present

## 2022-08-27 DIAGNOSIS — E039 Hypothyroidism, unspecified: Secondary | ICD-10-CM | POA: Diagnosis not present

## 2022-08-27 DIAGNOSIS — E785 Hyperlipidemia, unspecified: Secondary | ICD-10-CM | POA: Diagnosis not present

## 2022-08-28 DIAGNOSIS — I5043 Acute on chronic combined systolic (congestive) and diastolic (congestive) heart failure: Secondary | ICD-10-CM | POA: Diagnosis not present

## 2022-08-28 DIAGNOSIS — N183 Chronic kidney disease, stage 3 unspecified: Secondary | ICD-10-CM | POA: Diagnosis not present

## 2022-08-31 DIAGNOSIS — I5043 Acute on chronic combined systolic (congestive) and diastolic (congestive) heart failure: Secondary | ICD-10-CM | POA: Diagnosis not present

## 2022-09-03 DIAGNOSIS — I5043 Acute on chronic combined systolic (congestive) and diastolic (congestive) heart failure: Secondary | ICD-10-CM | POA: Diagnosis not present

## 2022-09-05 DIAGNOSIS — N138 Other obstructive and reflux uropathy: Secondary | ICD-10-CM | POA: Diagnosis not present

## 2022-09-05 DIAGNOSIS — N401 Enlarged prostate with lower urinary tract symptoms: Secondary | ICD-10-CM | POA: Diagnosis not present

## 2022-09-18 DIAGNOSIS — I509 Heart failure, unspecified: Secondary | ICD-10-CM | POA: Diagnosis not present

## 2022-09-18 DIAGNOSIS — Z09 Encounter for follow-up examination after completed treatment for conditions other than malignant neoplasm: Secondary | ICD-10-CM | POA: Diagnosis not present

## 2022-09-18 DIAGNOSIS — Z6820 Body mass index (BMI) 20.0-20.9, adult: Secondary | ICD-10-CM | POA: Diagnosis not present

## 2022-09-20 DIAGNOSIS — I110 Hypertensive heart disease with heart failure: Secondary | ICD-10-CM | POA: Diagnosis not present

## 2022-09-20 DIAGNOSIS — I2510 Atherosclerotic heart disease of native coronary artery without angina pectoris: Secondary | ICD-10-CM | POA: Diagnosis not present

## 2022-09-20 DIAGNOSIS — Z955 Presence of coronary angioplasty implant and graft: Secondary | ICD-10-CM | POA: Diagnosis not present

## 2022-09-20 DIAGNOSIS — I5043 Acute on chronic combined systolic (congestive) and diastolic (congestive) heart failure: Secondary | ICD-10-CM | POA: Diagnosis not present

## 2022-09-20 DIAGNOSIS — D638 Anemia in other chronic diseases classified elsewhere: Secondary | ICD-10-CM | POA: Diagnosis not present

## 2022-09-20 DIAGNOSIS — Z95810 Presence of automatic (implantable) cardiac defibrillator: Secondary | ICD-10-CM | POA: Diagnosis not present

## 2022-09-26 DIAGNOSIS — N401 Enlarged prostate with lower urinary tract symptoms: Secondary | ICD-10-CM | POA: Diagnosis not present

## 2022-09-26 DIAGNOSIS — N138 Other obstructive and reflux uropathy: Secondary | ICD-10-CM | POA: Diagnosis not present

## 2022-10-10 DIAGNOSIS — E079 Disorder of thyroid, unspecified: Secondary | ICD-10-CM | POA: Insufficient documentation

## 2022-10-10 DIAGNOSIS — E119 Type 2 diabetes mellitus without complications: Secondary | ICD-10-CM | POA: Insufficient documentation

## 2022-10-16 DIAGNOSIS — I110 Hypertensive heart disease with heart failure: Secondary | ICD-10-CM | POA: Diagnosis not present

## 2022-10-16 DIAGNOSIS — Z95810 Presence of automatic (implantable) cardiac defibrillator: Secondary | ICD-10-CM | POA: Diagnosis not present

## 2022-10-16 DIAGNOSIS — I2510 Atherosclerotic heart disease of native coronary artery without angina pectoris: Secondary | ICD-10-CM | POA: Diagnosis not present

## 2022-10-16 DIAGNOSIS — I5022 Chronic systolic (congestive) heart failure: Secondary | ICD-10-CM | POA: Diagnosis not present

## 2022-11-05 DIAGNOSIS — Z6820 Body mass index (BMI) 20.0-20.9, adult: Secondary | ICD-10-CM | POA: Diagnosis not present

## 2022-11-05 DIAGNOSIS — N481 Balanitis: Secondary | ICD-10-CM | POA: Diagnosis not present

## 2022-11-06 DIAGNOSIS — Z4502 Encounter for adjustment and management of automatic implantable cardiac defibrillator: Secondary | ICD-10-CM | POA: Diagnosis not present

## 2022-11-06 DIAGNOSIS — I5022 Chronic systolic (congestive) heart failure: Secondary | ICD-10-CM | POA: Diagnosis not present

## 2022-11-21 DIAGNOSIS — T380X5A Adverse effect of glucocorticoids and synthetic analogues, initial encounter: Secondary | ICD-10-CM | POA: Diagnosis not present

## 2022-11-21 DIAGNOSIS — R531 Weakness: Secondary | ICD-10-CM | POA: Diagnosis not present

## 2022-11-21 DIAGNOSIS — I5021 Acute systolic (congestive) heart failure: Secondary | ICD-10-CM | POA: Diagnosis not present

## 2022-11-21 DIAGNOSIS — R0602 Shortness of breath: Secondary | ICD-10-CM | POA: Diagnosis not present

## 2022-11-21 DIAGNOSIS — I340 Nonrheumatic mitral (valve) insufficiency: Secondary | ICD-10-CM | POA: Diagnosis not present

## 2022-11-21 DIAGNOSIS — I5023 Acute on chronic systolic (congestive) heart failure: Secondary | ICD-10-CM | POA: Diagnosis present

## 2022-11-21 DIAGNOSIS — I10 Essential (primary) hypertension: Secondary | ICD-10-CM | POA: Diagnosis not present

## 2022-11-21 DIAGNOSIS — R918 Other nonspecific abnormal finding of lung field: Secondary | ICD-10-CM | POA: Diagnosis not present

## 2022-11-21 DIAGNOSIS — R062 Wheezing: Secondary | ICD-10-CM | POA: Diagnosis not present

## 2022-11-21 DIAGNOSIS — D696 Thrombocytopenia, unspecified: Secondary | ICD-10-CM | POA: Diagnosis not present

## 2022-11-21 DIAGNOSIS — I493 Ventricular premature depolarization: Secondary | ICD-10-CM | POA: Diagnosis not present

## 2022-11-21 DIAGNOSIS — I509 Heart failure, unspecified: Secondary | ICD-10-CM | POA: Diagnosis not present

## 2022-11-21 DIAGNOSIS — I252 Old myocardial infarction: Secondary | ICD-10-CM | POA: Diagnosis not present

## 2022-11-21 DIAGNOSIS — J441 Chronic obstructive pulmonary disease with (acute) exacerbation: Secondary | ICD-10-CM | POA: Diagnosis not present

## 2022-11-21 DIAGNOSIS — J9601 Acute respiratory failure with hypoxia: Secondary | ICD-10-CM | POA: Diagnosis present

## 2022-11-21 DIAGNOSIS — Z7984 Long term (current) use of oral hypoglycemic drugs: Secondary | ICD-10-CM | POA: Diagnosis not present

## 2022-11-21 DIAGNOSIS — R0689 Other abnormalities of breathing: Secondary | ICD-10-CM | POA: Diagnosis not present

## 2022-11-21 DIAGNOSIS — I110 Hypertensive heart disease with heart failure: Secondary | ICD-10-CM | POA: Diagnosis present

## 2022-11-21 DIAGNOSIS — Z95810 Presence of automatic (implantable) cardiac defibrillator: Secondary | ICD-10-CM | POA: Diagnosis not present

## 2022-11-21 DIAGNOSIS — E039 Hypothyroidism, unspecified: Secondary | ICD-10-CM | POA: Diagnosis not present

## 2022-11-21 DIAGNOSIS — Z7952 Long term (current) use of systemic steroids: Secondary | ICD-10-CM | POA: Diagnosis not present

## 2022-11-21 DIAGNOSIS — R0902 Hypoxemia: Secondary | ICD-10-CM | POA: Diagnosis not present

## 2022-11-21 DIAGNOSIS — Z91013 Allergy to seafood: Secondary | ICD-10-CM | POA: Diagnosis not present

## 2022-11-21 DIAGNOSIS — Z1152 Encounter for screening for COVID-19: Secondary | ICD-10-CM | POA: Diagnosis not present

## 2022-11-21 DIAGNOSIS — R9431 Abnormal electrocardiogram [ECG] [EKG]: Secondary | ICD-10-CM | POA: Diagnosis not present

## 2022-11-21 DIAGNOSIS — I444 Left anterior fascicular block: Secondary | ICD-10-CM | POA: Diagnosis not present

## 2022-11-21 DIAGNOSIS — Z79899 Other long term (current) drug therapy: Secondary | ICD-10-CM | POA: Diagnosis not present

## 2022-11-21 DIAGNOSIS — I447 Left bundle-branch block, unspecified: Secondary | ICD-10-CM | POA: Diagnosis not present

## 2022-11-21 DIAGNOSIS — E1165 Type 2 diabetes mellitus with hyperglycemia: Secondary | ICD-10-CM | POA: Diagnosis not present

## 2022-11-22 DIAGNOSIS — I340 Nonrheumatic mitral (valve) insufficiency: Secondary | ICD-10-CM | POA: Diagnosis not present

## 2022-11-22 DIAGNOSIS — J9601 Acute respiratory failure with hypoxia: Secondary | ICD-10-CM | POA: Diagnosis not present

## 2022-11-22 DIAGNOSIS — I110 Hypertensive heart disease with heart failure: Secondary | ICD-10-CM | POA: Diagnosis not present

## 2022-11-22 DIAGNOSIS — I5023 Acute on chronic systolic (congestive) heart failure: Secondary | ICD-10-CM | POA: Diagnosis not present

## 2022-11-23 DIAGNOSIS — I110 Hypertensive heart disease with heart failure: Secondary | ICD-10-CM | POA: Diagnosis not present

## 2022-11-23 DIAGNOSIS — J9601 Acute respiratory failure with hypoxia: Secondary | ICD-10-CM | POA: Diagnosis not present

## 2022-11-23 DIAGNOSIS — I5023 Acute on chronic systolic (congestive) heart failure: Secondary | ICD-10-CM | POA: Diagnosis not present

## 2022-11-27 DIAGNOSIS — R5381 Other malaise: Secondary | ICD-10-CM | POA: Diagnosis not present

## 2022-11-27 DIAGNOSIS — R0902 Hypoxemia: Secondary | ICD-10-CM | POA: Diagnosis not present

## 2022-11-27 DIAGNOSIS — R509 Fever, unspecified: Secondary | ICD-10-CM | POA: Diagnosis not present

## 2022-11-27 DIAGNOSIS — W19XXXA Unspecified fall, initial encounter: Secondary | ICD-10-CM | POA: Diagnosis not present

## 2022-11-28 DIAGNOSIS — K5730 Diverticulosis of large intestine without perforation or abscess without bleeding: Secondary | ICD-10-CM | POA: Diagnosis not present

## 2022-11-28 DIAGNOSIS — R918 Other nonspecific abnormal finding of lung field: Secondary | ICD-10-CM | POA: Diagnosis not present

## 2022-11-28 DIAGNOSIS — Z95828 Presence of other vascular implants and grafts: Secondary | ICD-10-CM | POA: Diagnosis not present

## 2022-11-28 DIAGNOSIS — J159 Unspecified bacterial pneumonia: Secondary | ICD-10-CM | POA: Diagnosis not present

## 2022-11-28 DIAGNOSIS — J90 Pleural effusion, not elsewhere classified: Secondary | ICD-10-CM | POA: Diagnosis not present

## 2022-11-28 DIAGNOSIS — I5022 Chronic systolic (congestive) heart failure: Secondary | ICD-10-CM | POA: Diagnosis not present

## 2022-11-28 DIAGNOSIS — J9690 Respiratory failure, unspecified, unspecified whether with hypoxia or hypercapnia: Secondary | ICD-10-CM | POA: Diagnosis not present

## 2022-11-28 DIAGNOSIS — T366X5A Adverse effect of rifampicins, initial encounter: Secondary | ICD-10-CM | POA: Diagnosis not present

## 2022-11-28 DIAGNOSIS — Z91013 Allergy to seafood: Secondary | ICD-10-CM | POA: Diagnosis not present

## 2022-11-28 DIAGNOSIS — J440 Chronic obstructive pulmonary disease with acute lower respiratory infection: Secondary | ICD-10-CM | POA: Diagnosis not present

## 2022-11-28 DIAGNOSIS — I340 Nonrheumatic mitral (valve) insufficiency: Secondary | ICD-10-CM | POA: Diagnosis not present

## 2022-11-28 DIAGNOSIS — I361 Nonrheumatic tricuspid (valve) insufficiency: Secondary | ICD-10-CM | POA: Diagnosis not present

## 2022-11-28 DIAGNOSIS — E039 Hypothyroidism, unspecified: Secondary | ICD-10-CM | POA: Diagnosis not present

## 2022-11-28 DIAGNOSIS — R7881 Bacteremia: Secondary | ICD-10-CM | POA: Diagnosis not present

## 2022-11-28 DIAGNOSIS — B9561 Methicillin susceptible Staphylococcus aureus infection as the cause of diseases classified elsewhere: Secondary | ICD-10-CM | POA: Diagnosis not present

## 2022-11-28 DIAGNOSIS — I081 Rheumatic disorders of both mitral and tricuspid valves: Secondary | ICD-10-CM | POA: Diagnosis not present

## 2022-11-28 DIAGNOSIS — I2722 Pulmonary hypertension due to left heart disease: Secondary | ICD-10-CM | POA: Diagnosis not present

## 2022-11-28 DIAGNOSIS — A419 Sepsis, unspecified organism: Secondary | ICD-10-CM | POA: Diagnosis not present

## 2022-11-28 DIAGNOSIS — I255 Ischemic cardiomyopathy: Secondary | ICD-10-CM | POA: Diagnosis not present

## 2022-11-28 DIAGNOSIS — D638 Anemia in other chronic diseases classified elsewhere: Secondary | ICD-10-CM | POA: Diagnosis not present

## 2022-11-28 DIAGNOSIS — Z9981 Dependence on supplemental oxygen: Secondary | ICD-10-CM | POA: Diagnosis not present

## 2022-11-28 DIAGNOSIS — Z7984 Long term (current) use of oral hypoglycemic drugs: Secondary | ICD-10-CM | POA: Diagnosis not present

## 2022-11-28 DIAGNOSIS — I110 Hypertensive heart disease with heart failure: Secondary | ICD-10-CM | POA: Diagnosis not present

## 2022-11-28 DIAGNOSIS — R509 Fever, unspecified: Secondary | ICD-10-CM | POA: Diagnosis not present

## 2022-11-28 DIAGNOSIS — Z95810 Presence of automatic (implantable) cardiac defibrillator: Secondary | ICD-10-CM | POA: Diagnosis not present

## 2022-11-28 DIAGNOSIS — I509 Heart failure, unspecified: Secondary | ICD-10-CM | POA: Diagnosis not present

## 2022-11-28 DIAGNOSIS — D696 Thrombocytopenia, unspecified: Secondary | ICD-10-CM | POA: Diagnosis not present

## 2022-11-28 DIAGNOSIS — A4101 Sepsis due to Methicillin susceptible Staphylococcus aureus: Secondary | ICD-10-CM | POA: Diagnosis not present

## 2022-11-28 DIAGNOSIS — I2510 Atherosclerotic heart disease of native coronary artery without angina pectoris: Secondary | ICD-10-CM | POA: Diagnosis not present

## 2022-11-28 DIAGNOSIS — I252 Old myocardial infarction: Secondary | ICD-10-CM | POA: Diagnosis not present

## 2022-11-28 DIAGNOSIS — Z452 Encounter for adjustment and management of vascular access device: Secondary | ICD-10-CM | POA: Diagnosis not present

## 2022-11-28 DIAGNOSIS — I444 Left anterior fascicular block: Secondary | ICD-10-CM | POA: Diagnosis not present

## 2022-11-28 DIAGNOSIS — Z794 Long term (current) use of insulin: Secondary | ICD-10-CM | POA: Diagnosis not present

## 2022-11-28 DIAGNOSIS — Z79899 Other long term (current) drug therapy: Secondary | ICD-10-CM | POA: Diagnosis not present

## 2022-11-28 DIAGNOSIS — I771 Stricture of artery: Secondary | ICD-10-CM | POA: Diagnosis not present

## 2022-11-28 DIAGNOSIS — I959 Hypotension, unspecified: Secondary | ICD-10-CM | POA: Diagnosis not present

## 2022-11-28 DIAGNOSIS — G9341 Metabolic encephalopathy: Secondary | ICD-10-CM | POA: Diagnosis not present

## 2022-11-28 DIAGNOSIS — J9601 Acute respiratory failure with hypoxia: Secondary | ICD-10-CM | POA: Diagnosis not present

## 2022-11-28 DIAGNOSIS — E1165 Type 2 diabetes mellitus with hyperglycemia: Secondary | ICD-10-CM | POA: Diagnosis not present

## 2022-11-28 DIAGNOSIS — R6521 Severe sepsis with septic shock: Secondary | ICD-10-CM | POA: Diagnosis not present

## 2022-11-28 DIAGNOSIS — R4182 Altered mental status, unspecified: Secondary | ICD-10-CM | POA: Diagnosis not present

## 2022-11-28 DIAGNOSIS — R9431 Abnormal electrocardiogram [ECG] [EKG]: Secondary | ICD-10-CM | POA: Diagnosis not present

## 2022-11-28 DIAGNOSIS — I071 Rheumatic tricuspid insufficiency: Secondary | ICD-10-CM | POA: Diagnosis not present

## 2022-11-28 DIAGNOSIS — J189 Pneumonia, unspecified organism: Secondary | ICD-10-CM | POA: Diagnosis not present

## 2022-11-28 DIAGNOSIS — I272 Pulmonary hypertension, unspecified: Secondary | ICD-10-CM | POA: Diagnosis not present

## 2022-11-29 DIAGNOSIS — I361 Nonrheumatic tricuspid (valve) insufficiency: Secondary | ICD-10-CM | POA: Diagnosis not present

## 2022-12-05 DIAGNOSIS — I340 Nonrheumatic mitral (valve) insufficiency: Secondary | ICD-10-CM | POA: Diagnosis not present

## 2022-12-05 DIAGNOSIS — R7881 Bacteremia: Secondary | ICD-10-CM | POA: Diagnosis not present

## 2022-12-05 DIAGNOSIS — I272 Pulmonary hypertension, unspecified: Secondary | ICD-10-CM

## 2022-12-05 DIAGNOSIS — Z95810 Presence of automatic (implantable) cardiac defibrillator: Secondary | ICD-10-CM

## 2022-12-05 DIAGNOSIS — I071 Rheumatic tricuspid insufficiency: Secondary | ICD-10-CM

## 2022-12-06 DIAGNOSIS — I340 Nonrheumatic mitral (valve) insufficiency: Secondary | ICD-10-CM | POA: Diagnosis not present

## 2022-12-06 DIAGNOSIS — I272 Pulmonary hypertension, unspecified: Secondary | ICD-10-CM | POA: Diagnosis not present

## 2022-12-06 DIAGNOSIS — I071 Rheumatic tricuspid insufficiency: Secondary | ICD-10-CM | POA: Diagnosis not present

## 2022-12-06 DIAGNOSIS — R7881 Bacteremia: Secondary | ICD-10-CM | POA: Diagnosis not present

## 2022-12-07 DIAGNOSIS — Z9181 History of falling: Secondary | ICD-10-CM | POA: Diagnosis not present

## 2022-12-07 DIAGNOSIS — J15211 Pneumonia due to Methicillin susceptible Staphylococcus aureus: Secondary | ICD-10-CM | POA: Diagnosis not present

## 2022-12-07 DIAGNOSIS — I38 Endocarditis, valve unspecified: Secondary | ICD-10-CM | POA: Diagnosis not present

## 2022-12-07 DIAGNOSIS — J479 Bronchiectasis, uncomplicated: Secondary | ICD-10-CM | POA: Diagnosis not present

## 2022-12-07 DIAGNOSIS — K5730 Diverticulosis of large intestine without perforation or abscess without bleeding: Secondary | ICD-10-CM | POA: Diagnosis not present

## 2022-12-07 DIAGNOSIS — I255 Ischemic cardiomyopathy: Secondary | ICD-10-CM | POA: Diagnosis not present

## 2022-12-07 DIAGNOSIS — A4101 Sepsis due to Methicillin susceptible Staphylococcus aureus: Secondary | ICD-10-CM | POA: Diagnosis not present

## 2022-12-07 DIAGNOSIS — G9341 Metabolic encephalopathy: Secondary | ICD-10-CM | POA: Diagnosis not present

## 2022-12-07 DIAGNOSIS — E1165 Type 2 diabetes mellitus with hyperglycemia: Secondary | ICD-10-CM | POA: Diagnosis not present

## 2022-12-07 DIAGNOSIS — E039 Hypothyroidism, unspecified: Secondary | ICD-10-CM | POA: Diagnosis not present

## 2022-12-07 DIAGNOSIS — Z792 Long term (current) use of antibiotics: Secondary | ICD-10-CM | POA: Diagnosis not present

## 2022-12-07 DIAGNOSIS — I252 Old myocardial infarction: Secondary | ICD-10-CM | POA: Diagnosis not present

## 2022-12-07 DIAGNOSIS — Z452 Encounter for adjustment and management of vascular access device: Secondary | ICD-10-CM | POA: Diagnosis not present

## 2022-12-07 DIAGNOSIS — Z7985 Long-term (current) use of injectable non-insulin antidiabetic drugs: Secondary | ICD-10-CM | POA: Diagnosis not present

## 2022-12-07 DIAGNOSIS — Z95810 Presence of automatic (implantable) cardiac defibrillator: Secondary | ICD-10-CM | POA: Diagnosis not present

## 2022-12-07 DIAGNOSIS — J9601 Acute respiratory failure with hypoxia: Secondary | ICD-10-CM | POA: Diagnosis not present

## 2022-12-07 DIAGNOSIS — D638 Anemia in other chronic diseases classified elsewhere: Secondary | ICD-10-CM | POA: Diagnosis not present

## 2022-12-07 DIAGNOSIS — I110 Hypertensive heart disease with heart failure: Secondary | ICD-10-CM | POA: Diagnosis not present

## 2022-12-07 DIAGNOSIS — N481 Balanitis: Secondary | ICD-10-CM | POA: Diagnosis not present

## 2022-12-07 DIAGNOSIS — D696 Thrombocytopenia, unspecified: Secondary | ICD-10-CM | POA: Diagnosis not present

## 2022-12-07 DIAGNOSIS — I272 Pulmonary hypertension, unspecified: Secondary | ICD-10-CM | POA: Diagnosis not present

## 2022-12-07 DIAGNOSIS — I2510 Atherosclerotic heart disease of native coronary artery without angina pectoris: Secondary | ICD-10-CM | POA: Diagnosis not present

## 2022-12-07 DIAGNOSIS — Z7984 Long term (current) use of oral hypoglycemic drugs: Secondary | ICD-10-CM | POA: Diagnosis not present

## 2022-12-07 DIAGNOSIS — R6520 Severe sepsis without septic shock: Secondary | ICD-10-CM | POA: Diagnosis not present

## 2022-12-07 DIAGNOSIS — I5022 Chronic systolic (congestive) heart failure: Secondary | ICD-10-CM | POA: Diagnosis not present

## 2022-12-11 DIAGNOSIS — I420 Dilated cardiomyopathy: Secondary | ICD-10-CM | POA: Diagnosis not present

## 2022-12-11 DIAGNOSIS — R7881 Bacteremia: Secondary | ICD-10-CM | POA: Diagnosis not present

## 2022-12-11 DIAGNOSIS — I2510 Atherosclerotic heart disease of native coronary artery without angina pectoris: Secondary | ICD-10-CM | POA: Diagnosis not present

## 2022-12-11 DIAGNOSIS — Z6820 Body mass index (BMI) 20.0-20.9, adult: Secondary | ICD-10-CM | POA: Diagnosis not present

## 2022-12-11 DIAGNOSIS — I38 Endocarditis, valve unspecified: Secondary | ICD-10-CM | POA: Diagnosis not present

## 2022-12-11 DIAGNOSIS — I10 Essential (primary) hypertension: Secondary | ICD-10-CM | POA: Diagnosis not present

## 2022-12-11 DIAGNOSIS — R5382 Chronic fatigue, unspecified: Secondary | ICD-10-CM | POA: Diagnosis not present

## 2022-12-11 DIAGNOSIS — Z95810 Presence of automatic (implantable) cardiac defibrillator: Secondary | ICD-10-CM | POA: Diagnosis not present

## 2022-12-11 DIAGNOSIS — I255 Ischemic cardiomyopathy: Secondary | ICD-10-CM | POA: Diagnosis not present

## 2022-12-11 DIAGNOSIS — D638 Anemia in other chronic diseases classified elsewhere: Secondary | ICD-10-CM | POA: Diagnosis not present

## 2022-12-12 DIAGNOSIS — D696 Thrombocytopenia, unspecified: Secondary | ICD-10-CM | POA: Diagnosis not present

## 2022-12-12 DIAGNOSIS — Z5181 Encounter for therapeutic drug level monitoring: Secondary | ICD-10-CM | POA: Diagnosis not present

## 2022-12-12 DIAGNOSIS — I38 Endocarditis, valve unspecified: Secondary | ICD-10-CM | POA: Diagnosis not present

## 2022-12-12 DIAGNOSIS — J449 Chronic obstructive pulmonary disease, unspecified: Secondary | ICD-10-CM | POA: Diagnosis not present

## 2022-12-12 DIAGNOSIS — I2510 Atherosclerotic heart disease of native coronary artery without angina pectoris: Secondary | ICD-10-CM | POA: Diagnosis not present

## 2022-12-12 DIAGNOSIS — I272 Pulmonary hypertension, unspecified: Secondary | ICD-10-CM | POA: Diagnosis not present

## 2022-12-12 DIAGNOSIS — E1165 Type 2 diabetes mellitus with hyperglycemia: Secondary | ICD-10-CM | POA: Diagnosis not present

## 2022-12-12 DIAGNOSIS — J479 Bronchiectasis, uncomplicated: Secondary | ICD-10-CM | POA: Diagnosis not present

## 2022-12-12 DIAGNOSIS — I110 Hypertensive heart disease with heart failure: Secondary | ICD-10-CM | POA: Diagnosis not present

## 2022-12-13 DIAGNOSIS — E1165 Type 2 diabetes mellitus with hyperglycemia: Secondary | ICD-10-CM | POA: Diagnosis not present

## 2022-12-13 DIAGNOSIS — Z7984 Long term (current) use of oral hypoglycemic drugs: Secondary | ICD-10-CM | POA: Diagnosis not present

## 2022-12-13 DIAGNOSIS — E039 Hypothyroidism, unspecified: Secondary | ICD-10-CM | POA: Diagnosis not present

## 2022-12-13 DIAGNOSIS — I10 Essential (primary) hypertension: Secondary | ICD-10-CM | POA: Diagnosis not present

## 2022-12-13 DIAGNOSIS — E785 Hyperlipidemia, unspecified: Secondary | ICD-10-CM | POA: Diagnosis not present

## 2022-12-13 DIAGNOSIS — Z794 Long term (current) use of insulin: Secondary | ICD-10-CM | POA: Diagnosis not present

## 2022-12-19 DIAGNOSIS — I2510 Atherosclerotic heart disease of native coronary artery without angina pectoris: Secondary | ICD-10-CM | POA: Diagnosis not present

## 2022-12-19 DIAGNOSIS — I272 Pulmonary hypertension, unspecified: Secondary | ICD-10-CM | POA: Diagnosis not present

## 2022-12-19 DIAGNOSIS — J479 Bronchiectasis, uncomplicated: Secondary | ICD-10-CM | POA: Diagnosis not present

## 2022-12-19 DIAGNOSIS — I110 Hypertensive heart disease with heart failure: Secondary | ICD-10-CM | POA: Diagnosis not present

## 2022-12-19 DIAGNOSIS — D696 Thrombocytopenia, unspecified: Secondary | ICD-10-CM | POA: Diagnosis not present

## 2022-12-19 DIAGNOSIS — E1165 Type 2 diabetes mellitus with hyperglycemia: Secondary | ICD-10-CM | POA: Diagnosis not present

## 2022-12-19 DIAGNOSIS — Z5181 Encounter for therapeutic drug level monitoring: Secondary | ICD-10-CM | POA: Diagnosis not present

## 2022-12-24 DIAGNOSIS — Z79899 Other long term (current) drug therapy: Secondary | ICD-10-CM | POA: Diagnosis not present

## 2022-12-24 DIAGNOSIS — I255 Ischemic cardiomyopathy: Secondary | ICD-10-CM | POA: Diagnosis not present

## 2022-12-24 DIAGNOSIS — R0602 Shortness of breath: Secondary | ICD-10-CM | POA: Diagnosis not present

## 2022-12-24 DIAGNOSIS — J9601 Acute respiratory failure with hypoxia: Secondary | ICD-10-CM | POA: Diagnosis not present

## 2022-12-24 DIAGNOSIS — R918 Other nonspecific abnormal finding of lung field: Secondary | ICD-10-CM | POA: Diagnosis not present

## 2022-12-24 DIAGNOSIS — E1165 Type 2 diabetes mellitus with hyperglycemia: Secondary | ICD-10-CM | POA: Diagnosis not present

## 2022-12-24 DIAGNOSIS — I10 Essential (primary) hypertension: Secondary | ICD-10-CM | POA: Diagnosis not present

## 2022-12-24 DIAGNOSIS — Z1152 Encounter for screening for COVID-19: Secondary | ICD-10-CM | POA: Diagnosis not present

## 2022-12-24 DIAGNOSIS — Z91013 Allergy to seafood: Secondary | ICD-10-CM | POA: Diagnosis not present

## 2022-12-24 DIAGNOSIS — Z95810 Presence of automatic (implantable) cardiac defibrillator: Secondary | ICD-10-CM | POA: Diagnosis not present

## 2022-12-24 DIAGNOSIS — D696 Thrombocytopenia, unspecified: Secondary | ICD-10-CM | POA: Diagnosis not present

## 2022-12-24 DIAGNOSIS — E039 Hypothyroidism, unspecified: Secondary | ICD-10-CM | POA: Diagnosis not present

## 2022-12-24 DIAGNOSIS — Z452 Encounter for adjustment and management of vascular access device: Secondary | ICD-10-CM | POA: Diagnosis not present

## 2022-12-24 DIAGNOSIS — I110 Hypertensive heart disease with heart failure: Secondary | ICD-10-CM | POA: Diagnosis not present

## 2022-12-24 DIAGNOSIS — Z7951 Long term (current) use of inhaled steroids: Secondary | ICD-10-CM | POA: Diagnosis not present

## 2022-12-24 DIAGNOSIS — E785 Hyperlipidemia, unspecified: Secondary | ICD-10-CM | POA: Diagnosis not present

## 2022-12-24 DIAGNOSIS — Z7984 Long term (current) use of oral hypoglycemic drugs: Secondary | ICD-10-CM | POA: Diagnosis not present

## 2022-12-24 DIAGNOSIS — J449 Chronic obstructive pulmonary disease, unspecified: Secondary | ICD-10-CM | POA: Diagnosis not present

## 2022-12-24 DIAGNOSIS — I252 Old myocardial infarction: Secondary | ICD-10-CM | POA: Diagnosis not present

## 2022-12-24 DIAGNOSIS — R000 Tachycardia, unspecified: Secondary | ICD-10-CM | POA: Diagnosis not present

## 2022-12-24 DIAGNOSIS — R0689 Other abnormalities of breathing: Secondary | ICD-10-CM | POA: Diagnosis not present

## 2022-12-24 DIAGNOSIS — I509 Heart failure, unspecified: Secondary | ICD-10-CM | POA: Diagnosis not present

## 2022-12-24 DIAGNOSIS — R61 Generalized hyperhidrosis: Secondary | ICD-10-CM | POA: Diagnosis not present

## 2022-12-24 DIAGNOSIS — Z95828 Presence of other vascular implants and grafts: Secondary | ICD-10-CM | POA: Diagnosis not present

## 2022-12-24 DIAGNOSIS — I38 Endocarditis, valve unspecified: Secondary | ICD-10-CM | POA: Diagnosis not present

## 2022-12-24 DIAGNOSIS — I5023 Acute on chronic systolic (congestive) heart failure: Secondary | ICD-10-CM | POA: Diagnosis not present

## 2022-12-24 DIAGNOSIS — Z794 Long term (current) use of insulin: Secondary | ICD-10-CM | POA: Diagnosis not present

## 2022-12-24 DIAGNOSIS — D649 Anemia, unspecified: Secondary | ICD-10-CM | POA: Diagnosis not present

## 2022-12-24 DIAGNOSIS — I2510 Atherosclerotic heart disease of native coronary artery without angina pectoris: Secondary | ICD-10-CM | POA: Diagnosis not present

## 2022-12-27 ENCOUNTER — Ambulatory Visit: Payer: Medicare Other | Admitting: Internal Medicine

## 2022-12-27 DIAGNOSIS — I2510 Atherosclerotic heart disease of native coronary artery without angina pectoris: Secondary | ICD-10-CM | POA: Diagnosis not present

## 2022-12-27 DIAGNOSIS — J479 Bronchiectasis, uncomplicated: Secondary | ICD-10-CM | POA: Diagnosis not present

## 2022-12-27 DIAGNOSIS — I110 Hypertensive heart disease with heart failure: Secondary | ICD-10-CM | POA: Diagnosis not present

## 2022-12-27 DIAGNOSIS — D696 Thrombocytopenia, unspecified: Secondary | ICD-10-CM | POA: Diagnosis not present

## 2022-12-27 DIAGNOSIS — Z5181 Encounter for therapeutic drug level monitoring: Secondary | ICD-10-CM | POA: Diagnosis not present

## 2022-12-27 DIAGNOSIS — E1165 Type 2 diabetes mellitus with hyperglycemia: Secondary | ICD-10-CM | POA: Diagnosis not present

## 2022-12-27 DIAGNOSIS — I272 Pulmonary hypertension, unspecified: Secondary | ICD-10-CM | POA: Diagnosis not present

## 2022-12-31 DIAGNOSIS — I110 Hypertensive heart disease with heart failure: Secondary | ICD-10-CM | POA: Diagnosis not present

## 2022-12-31 DIAGNOSIS — R918 Other nonspecific abnormal finding of lung field: Secondary | ICD-10-CM | POA: Diagnosis not present

## 2022-12-31 DIAGNOSIS — Z7984 Long term (current) use of oral hypoglycemic drugs: Secondary | ICD-10-CM | POA: Diagnosis not present

## 2022-12-31 DIAGNOSIS — Z95828 Presence of other vascular implants and grafts: Secondary | ICD-10-CM | POA: Diagnosis not present

## 2022-12-31 DIAGNOSIS — I5023 Acute on chronic systolic (congestive) heart failure: Secondary | ICD-10-CM | POA: Diagnosis not present

## 2022-12-31 DIAGNOSIS — R000 Tachycardia, unspecified: Secondary | ICD-10-CM | POA: Diagnosis not present

## 2022-12-31 DIAGNOSIS — J90 Pleural effusion, not elsewhere classified: Secondary | ICD-10-CM | POA: Diagnosis not present

## 2022-12-31 DIAGNOSIS — I272 Pulmonary hypertension, unspecified: Secondary | ICD-10-CM | POA: Diagnosis not present

## 2022-12-31 DIAGNOSIS — R0689 Other abnormalities of breathing: Secondary | ICD-10-CM | POA: Diagnosis not present

## 2022-12-31 DIAGNOSIS — I330 Acute and subacute infective endocarditis: Secondary | ICD-10-CM | POA: Diagnosis not present

## 2022-12-31 DIAGNOSIS — R0602 Shortness of breath: Secondary | ICD-10-CM | POA: Diagnosis not present

## 2022-12-31 DIAGNOSIS — Z1152 Encounter for screening for COVID-19: Secondary | ICD-10-CM | POA: Diagnosis not present

## 2022-12-31 DIAGNOSIS — J984 Other disorders of lung: Secondary | ICD-10-CM | POA: Diagnosis not present

## 2022-12-31 DIAGNOSIS — R0902 Hypoxemia: Secondary | ICD-10-CM | POA: Diagnosis not present

## 2022-12-31 DIAGNOSIS — I10 Essential (primary) hypertension: Secondary | ICD-10-CM | POA: Diagnosis not present

## 2022-12-31 DIAGNOSIS — B3749 Other urogenital candidiasis: Secondary | ICD-10-CM | POA: Diagnosis not present

## 2022-12-31 DIAGNOSIS — Z8701 Personal history of pneumonia (recurrent): Secondary | ICD-10-CM | POA: Diagnosis not present

## 2022-12-31 DIAGNOSIS — I255 Ischemic cardiomyopathy: Secondary | ICD-10-CM | POA: Diagnosis not present

## 2022-12-31 DIAGNOSIS — Z79899 Other long term (current) drug therapy: Secondary | ICD-10-CM | POA: Diagnosis not present

## 2022-12-31 DIAGNOSIS — I509 Heart failure, unspecified: Secondary | ICD-10-CM | POA: Diagnosis not present

## 2022-12-31 DIAGNOSIS — E039 Hypothyroidism, unspecified: Secondary | ICD-10-CM | POA: Diagnosis not present

## 2022-12-31 DIAGNOSIS — Z794 Long term (current) use of insulin: Secondary | ICD-10-CM | POA: Diagnosis not present

## 2022-12-31 DIAGNOSIS — I21A1 Myocardial infarction type 2: Secondary | ICD-10-CM | POA: Diagnosis not present

## 2022-12-31 DIAGNOSIS — R61 Generalized hyperhidrosis: Secondary | ICD-10-CM | POA: Diagnosis not present

## 2022-12-31 DIAGNOSIS — R739 Hyperglycemia, unspecified: Secondary | ICD-10-CM | POA: Diagnosis not present

## 2022-12-31 DIAGNOSIS — I340 Nonrheumatic mitral (valve) insufficiency: Secondary | ICD-10-CM | POA: Diagnosis not present

## 2022-12-31 DIAGNOSIS — E119 Type 2 diabetes mellitus without complications: Secondary | ICD-10-CM | POA: Diagnosis not present

## 2022-12-31 DIAGNOSIS — B9561 Methicillin susceptible Staphylococcus aureus infection as the cause of diseases classified elsewhere: Secondary | ICD-10-CM | POA: Diagnosis not present

## 2022-12-31 DIAGNOSIS — J449 Chronic obstructive pulmonary disease, unspecified: Secondary | ICD-10-CM | POA: Diagnosis not present

## 2022-12-31 DIAGNOSIS — D696 Thrombocytopenia, unspecified: Secondary | ICD-10-CM | POA: Diagnosis not present

## 2022-12-31 DIAGNOSIS — Z91013 Allergy to seafood: Secondary | ICD-10-CM | POA: Diagnosis not present

## 2022-12-31 DIAGNOSIS — Z95810 Presence of automatic (implantable) cardiac defibrillator: Secondary | ICD-10-CM | POA: Diagnosis not present

## 2022-12-31 DIAGNOSIS — J9600 Acute respiratory failure, unspecified whether with hypoxia or hypercapnia: Secondary | ICD-10-CM | POA: Diagnosis not present

## 2022-12-31 DIAGNOSIS — J9601 Acute respiratory failure with hypoxia: Secondary | ICD-10-CM | POA: Diagnosis not present

## 2022-12-31 DIAGNOSIS — I252 Old myocardial infarction: Secondary | ICD-10-CM | POA: Diagnosis not present

## 2023-01-01 DIAGNOSIS — I340 Nonrheumatic mitral (valve) insufficiency: Secondary | ICD-10-CM | POA: Diagnosis not present

## 2023-01-01 DIAGNOSIS — Z95810 Presence of automatic (implantable) cardiac defibrillator: Secondary | ICD-10-CM | POA: Diagnosis not present

## 2023-01-01 DIAGNOSIS — I5023 Acute on chronic systolic (congestive) heart failure: Secondary | ICD-10-CM | POA: Diagnosis not present

## 2023-01-02 DIAGNOSIS — Z95810 Presence of automatic (implantable) cardiac defibrillator: Secondary | ICD-10-CM | POA: Diagnosis not present

## 2023-01-02 DIAGNOSIS — I340 Nonrheumatic mitral (valve) insufficiency: Secondary | ICD-10-CM | POA: Diagnosis not present

## 2023-01-02 DIAGNOSIS — I5023 Acute on chronic systolic (congestive) heart failure: Secondary | ICD-10-CM | POA: Diagnosis not present

## 2023-01-03 DIAGNOSIS — E861 Hypovolemia: Secondary | ICD-10-CM | POA: Diagnosis not present

## 2023-01-03 DIAGNOSIS — A419 Sepsis, unspecified organism: Secondary | ICD-10-CM | POA: Diagnosis not present

## 2023-01-03 DIAGNOSIS — E162 Hypoglycemia, unspecified: Secondary | ICD-10-CM | POA: Diagnosis not present

## 2023-01-03 DIAGNOSIS — Z8619 Personal history of other infectious and parasitic diseases: Secondary | ICD-10-CM | POA: Diagnosis not present

## 2023-01-03 DIAGNOSIS — I959 Hypotension, unspecified: Secondary | ICD-10-CM | POA: Diagnosis not present

## 2023-01-03 DIAGNOSIS — Z87891 Personal history of nicotine dependence: Secondary | ICD-10-CM | POA: Diagnosis not present

## 2023-01-03 DIAGNOSIS — I5022 Chronic systolic (congestive) heart failure: Secondary | ICD-10-CM | POA: Diagnosis not present

## 2023-01-03 DIAGNOSIS — Z95810 Presence of automatic (implantable) cardiac defibrillator: Secondary | ICD-10-CM | POA: Diagnosis not present

## 2023-01-03 DIAGNOSIS — J449 Chronic obstructive pulmonary disease, unspecified: Secondary | ICD-10-CM | POA: Diagnosis not present

## 2023-01-03 DIAGNOSIS — I2510 Atherosclerotic heart disease of native coronary artery without angina pectoris: Secondary | ICD-10-CM | POA: Diagnosis not present

## 2023-01-03 DIAGNOSIS — Z794 Long term (current) use of insulin: Secondary | ICD-10-CM | POA: Diagnosis not present

## 2023-01-03 DIAGNOSIS — E1165 Type 2 diabetes mellitus with hyperglycemia: Secondary | ICD-10-CM | POA: Diagnosis not present

## 2023-01-03 DIAGNOSIS — R918 Other nonspecific abnormal finding of lung field: Secondary | ICD-10-CM | POA: Diagnosis not present

## 2023-01-03 DIAGNOSIS — E872 Acidosis, unspecified: Secondary | ICD-10-CM | POA: Diagnosis not present

## 2023-01-03 DIAGNOSIS — I9589 Other hypotension: Secondary | ICD-10-CM | POA: Diagnosis not present

## 2023-01-03 DIAGNOSIS — I110 Hypertensive heart disease with heart failure: Secondary | ICD-10-CM | POA: Diagnosis not present

## 2023-01-03 DIAGNOSIS — I21A1 Myocardial infarction type 2: Secondary | ICD-10-CM | POA: Diagnosis not present

## 2023-01-03 DIAGNOSIS — E785 Hyperlipidemia, unspecified: Secondary | ICD-10-CM | POA: Diagnosis not present

## 2023-01-03 DIAGNOSIS — E871 Hypo-osmolality and hyponatremia: Secondary | ICD-10-CM | POA: Diagnosis not present

## 2023-01-03 DIAGNOSIS — I252 Old myocardial infarction: Secondary | ICD-10-CM | POA: Diagnosis not present

## 2023-01-06 DIAGNOSIS — J15211 Pneumonia due to Methicillin susceptible Staphylococcus aureus: Secondary | ICD-10-CM | POA: Diagnosis not present

## 2023-01-06 DIAGNOSIS — I2510 Atherosclerotic heart disease of native coronary artery without angina pectoris: Secondary | ICD-10-CM | POA: Diagnosis not present

## 2023-01-06 DIAGNOSIS — I252 Old myocardial infarction: Secondary | ICD-10-CM | POA: Diagnosis not present

## 2023-01-06 DIAGNOSIS — I272 Pulmonary hypertension, unspecified: Secondary | ICD-10-CM | POA: Diagnosis not present

## 2023-01-06 DIAGNOSIS — D638 Anemia in other chronic diseases classified elsewhere: Secondary | ICD-10-CM | POA: Diagnosis not present

## 2023-01-06 DIAGNOSIS — Z95810 Presence of automatic (implantable) cardiac defibrillator: Secondary | ICD-10-CM | POA: Diagnosis not present

## 2023-01-06 DIAGNOSIS — Z7985 Long-term (current) use of injectable non-insulin antidiabetic drugs: Secondary | ICD-10-CM | POA: Diagnosis not present

## 2023-01-06 DIAGNOSIS — G9341 Metabolic encephalopathy: Secondary | ICD-10-CM | POA: Diagnosis not present

## 2023-01-06 DIAGNOSIS — J479 Bronchiectasis, uncomplicated: Secondary | ICD-10-CM | POA: Diagnosis not present

## 2023-01-06 DIAGNOSIS — I255 Ischemic cardiomyopathy: Secondary | ICD-10-CM | POA: Diagnosis not present

## 2023-01-06 DIAGNOSIS — I38 Endocarditis, valve unspecified: Secondary | ICD-10-CM | POA: Diagnosis not present

## 2023-01-06 DIAGNOSIS — Z7984 Long term (current) use of oral hypoglycemic drugs: Secondary | ICD-10-CM | POA: Diagnosis not present

## 2023-01-06 DIAGNOSIS — D696 Thrombocytopenia, unspecified: Secondary | ICD-10-CM | POA: Diagnosis not present

## 2023-01-06 DIAGNOSIS — E1165 Type 2 diabetes mellitus with hyperglycemia: Secondary | ICD-10-CM | POA: Diagnosis not present

## 2023-01-06 DIAGNOSIS — Z792 Long term (current) use of antibiotics: Secondary | ICD-10-CM | POA: Diagnosis not present

## 2023-01-06 DIAGNOSIS — K5730 Diverticulosis of large intestine without perforation or abscess without bleeding: Secondary | ICD-10-CM | POA: Diagnosis not present

## 2023-01-06 DIAGNOSIS — A4101 Sepsis due to Methicillin susceptible Staphylococcus aureus: Secondary | ICD-10-CM | POA: Diagnosis not present

## 2023-01-06 DIAGNOSIS — R6520 Severe sepsis without septic shock: Secondary | ICD-10-CM | POA: Diagnosis not present

## 2023-01-06 DIAGNOSIS — J440 Chronic obstructive pulmonary disease with acute lower respiratory infection: Secondary | ICD-10-CM | POA: Diagnosis not present

## 2023-01-06 DIAGNOSIS — E039 Hypothyroidism, unspecified: Secondary | ICD-10-CM | POA: Diagnosis not present

## 2023-01-06 DIAGNOSIS — Z9181 History of falling: Secondary | ICD-10-CM | POA: Diagnosis not present

## 2023-01-06 DIAGNOSIS — J9601 Acute respiratory failure with hypoxia: Secondary | ICD-10-CM | POA: Diagnosis not present

## 2023-01-06 DIAGNOSIS — I5022 Chronic systolic (congestive) heart failure: Secondary | ICD-10-CM | POA: Diagnosis not present

## 2023-01-06 DIAGNOSIS — N481 Balanitis: Secondary | ICD-10-CM | POA: Diagnosis not present

## 2023-01-06 DIAGNOSIS — I110 Hypertensive heart disease with heart failure: Secondary | ICD-10-CM | POA: Diagnosis not present

## 2023-01-07 DIAGNOSIS — I340 Nonrheumatic mitral (valve) insufficiency: Secondary | ICD-10-CM | POA: Diagnosis not present

## 2023-01-07 DIAGNOSIS — I255 Ischemic cardiomyopathy: Secondary | ICD-10-CM | POA: Diagnosis not present

## 2023-01-07 DIAGNOSIS — I10 Essential (primary) hypertension: Secondary | ICD-10-CM | POA: Diagnosis not present

## 2023-01-07 DIAGNOSIS — E039 Hypothyroidism, unspecified: Secondary | ICD-10-CM | POA: Diagnosis not present

## 2023-01-07 DIAGNOSIS — I2510 Atherosclerotic heart disease of native coronary artery without angina pectoris: Secondary | ICD-10-CM | POA: Diagnosis not present

## 2023-01-07 DIAGNOSIS — I272 Pulmonary hypertension, unspecified: Secondary | ICD-10-CM | POA: Diagnosis not present

## 2023-01-07 DIAGNOSIS — I420 Dilated cardiomyopathy: Secondary | ICD-10-CM | POA: Diagnosis not present

## 2023-01-07 DIAGNOSIS — E782 Mixed hyperlipidemia: Secondary | ICD-10-CM | POA: Diagnosis not present

## 2023-01-07 DIAGNOSIS — E119 Type 2 diabetes mellitus without complications: Secondary | ICD-10-CM | POA: Diagnosis not present

## 2023-01-07 DIAGNOSIS — I071 Rheumatic tricuspid insufficiency: Secondary | ICD-10-CM | POA: Diagnosis not present

## 2023-01-07 DIAGNOSIS — Z95810 Presence of automatic (implantable) cardiac defibrillator: Secondary | ICD-10-CM | POA: Diagnosis not present

## 2023-01-08 DIAGNOSIS — I2510 Atherosclerotic heart disease of native coronary artery without angina pectoris: Secondary | ICD-10-CM | POA: Diagnosis not present

## 2023-01-08 DIAGNOSIS — D696 Thrombocytopenia, unspecified: Secondary | ICD-10-CM | POA: Diagnosis not present

## 2023-01-08 DIAGNOSIS — I110 Hypertensive heart disease with heart failure: Secondary | ICD-10-CM | POA: Diagnosis not present

## 2023-01-08 DIAGNOSIS — E1165 Type 2 diabetes mellitus with hyperglycemia: Secondary | ICD-10-CM | POA: Diagnosis not present

## 2023-01-08 DIAGNOSIS — I272 Pulmonary hypertension, unspecified: Secondary | ICD-10-CM | POA: Diagnosis not present

## 2023-01-08 DIAGNOSIS — J479 Bronchiectasis, uncomplicated: Secondary | ICD-10-CM | POA: Diagnosis not present

## 2023-01-09 DIAGNOSIS — E1165 Type 2 diabetes mellitus with hyperglycemia: Secondary | ICD-10-CM | POA: Diagnosis not present

## 2023-01-09 DIAGNOSIS — Z978 Presence of other specified devices: Secondary | ICD-10-CM | POA: Diagnosis not present

## 2023-01-09 DIAGNOSIS — I10 Essential (primary) hypertension: Secondary | ICD-10-CM | POA: Diagnosis not present

## 2023-01-09 DIAGNOSIS — E039 Hypothyroidism, unspecified: Secondary | ICD-10-CM | POA: Diagnosis not present

## 2023-01-09 DIAGNOSIS — Z794 Long term (current) use of insulin: Secondary | ICD-10-CM | POA: Diagnosis not present

## 2023-01-09 DIAGNOSIS — E785 Hyperlipidemia, unspecified: Secondary | ICD-10-CM | POA: Diagnosis not present

## 2023-01-10 DIAGNOSIS — E039 Hypothyroidism, unspecified: Secondary | ICD-10-CM | POA: Diagnosis not present

## 2023-01-10 DIAGNOSIS — G4700 Insomnia, unspecified: Secondary | ICD-10-CM | POA: Diagnosis not present

## 2023-01-10 DIAGNOSIS — Z6820 Body mass index (BMI) 20.0-20.9, adult: Secondary | ICD-10-CM | POA: Diagnosis not present

## 2023-01-11 DIAGNOSIS — I2510 Atherosclerotic heart disease of native coronary artery without angina pectoris: Secondary | ICD-10-CM | POA: Diagnosis not present

## 2023-01-11 DIAGNOSIS — I272 Pulmonary hypertension, unspecified: Secondary | ICD-10-CM | POA: Diagnosis not present

## 2023-01-11 DIAGNOSIS — J479 Bronchiectasis, uncomplicated: Secondary | ICD-10-CM | POA: Diagnosis not present

## 2023-01-11 DIAGNOSIS — I110 Hypertensive heart disease with heart failure: Secondary | ICD-10-CM | POA: Diagnosis not present

## 2023-01-11 DIAGNOSIS — D696 Thrombocytopenia, unspecified: Secondary | ICD-10-CM | POA: Diagnosis not present

## 2023-01-11 DIAGNOSIS — E1165 Type 2 diabetes mellitus with hyperglycemia: Secondary | ICD-10-CM | POA: Diagnosis not present

## 2023-01-15 DIAGNOSIS — D696 Thrombocytopenia, unspecified: Secondary | ICD-10-CM | POA: Diagnosis not present

## 2023-01-15 DIAGNOSIS — E1165 Type 2 diabetes mellitus with hyperglycemia: Secondary | ICD-10-CM | POA: Diagnosis not present

## 2023-01-15 DIAGNOSIS — E611 Iron deficiency: Secondary | ICD-10-CM | POA: Diagnosis not present

## 2023-01-15 DIAGNOSIS — Z5181 Encounter for therapeutic drug level monitoring: Secondary | ICD-10-CM | POA: Diagnosis not present

## 2023-01-15 DIAGNOSIS — I272 Pulmonary hypertension, unspecified: Secondary | ICD-10-CM | POA: Diagnosis not present

## 2023-01-15 DIAGNOSIS — D693 Immune thrombocytopenic purpura: Secondary | ICD-10-CM | POA: Diagnosis not present

## 2023-01-15 DIAGNOSIS — I2510 Atherosclerotic heart disease of native coronary artery without angina pectoris: Secondary | ICD-10-CM | POA: Diagnosis not present

## 2023-01-15 DIAGNOSIS — I110 Hypertensive heart disease with heart failure: Secondary | ICD-10-CM | POA: Diagnosis not present

## 2023-01-15 DIAGNOSIS — J479 Bronchiectasis, uncomplicated: Secondary | ICD-10-CM | POA: Diagnosis not present

## 2023-01-15 DIAGNOSIS — D638 Anemia in other chronic diseases classified elsewhere: Secondary | ICD-10-CM | POA: Diagnosis not present

## 2023-01-16 ENCOUNTER — Encounter: Payer: Self-pay | Admitting: Internal Medicine

## 2023-01-16 ENCOUNTER — Telehealth: Payer: Self-pay

## 2023-01-16 ENCOUNTER — Ambulatory Visit (INDEPENDENT_AMBULATORY_CARE_PROVIDER_SITE_OTHER): Payer: Medicare Other | Admitting: Internal Medicine

## 2023-01-16 ENCOUNTER — Other Ambulatory Visit: Payer: Self-pay

## 2023-01-16 VITALS — BP 99/65 | HR 77 | Resp 16 | Ht 70.0 in | Wt 133.4 lb

## 2023-01-16 DIAGNOSIS — B9561 Methicillin susceptible Staphylococcus aureus infection as the cause of diseases classified elsewhere: Secondary | ICD-10-CM | POA: Diagnosis not present

## 2023-01-16 DIAGNOSIS — Z95810 Presence of automatic (implantable) cardiac defibrillator: Secondary | ICD-10-CM

## 2023-01-16 DIAGNOSIS — R7881 Bacteremia: Secondary | ICD-10-CM

## 2023-01-16 DIAGNOSIS — I255 Ischemic cardiomyopathy: Secondary | ICD-10-CM

## 2023-01-16 MED ORDER — CEFADROXIL 500 MG PO CAPS: 1000.0000 mg | ORAL_CAPSULE | Freq: Two times a day (BID) | ORAL | 11 refills | Status: DC

## 2023-01-16 NOTE — Patient Instructions (Signed)
Your home health can remove picc and stop cefazolin after today   Pick up cefadroxil 1000 mg twice a day tomorrow 10/10; indefinite therapy planned    See me in 4 weeks

## 2023-01-16 NOTE — Telephone Encounter (Signed)
Called referring provider at [facility] and requested more records such as Echo, labs, med list, consults from inpatient sepsis visit as well IV abx information. Spoke to referral coordinator who will fax records to [phone].

## 2023-01-16 NOTE — Telephone Encounter (Signed)
  Per provider ok to PULL PICC after End Date.   Provider: Dr Renold Don  End Date: 01/16/2023 - Seen as new patient from [place] and came with IV ordered while inpatient at [facility]. Will STOP and start oral abx.   Contacted [facility] spoke to Alvino Chapel who is an Charity fundraiser and will pull picc on Friday. Advised patient and forwarded this to [facility] Team as fyi.

## 2023-01-16 NOTE — Progress Notes (Signed)
[facility]  Reason for Consult:mssa bacteremia Referring Provider: Juleen China (primary care)    Patient Active Problem List   Diagnosis Date Noted   Sepsis (HCC) 01/03/2023   Bacteremia 12/11/2022   Thyroid disease 10/10/2022   Diabetes mellitus (HCC) 10/10/2022   Acute on chronic combined systolic and diastolic heart failure (HCC) 08/22/2022   Hoarse 05/23/2022   Fatigue 11/02/2021   Anemia of chronic disease 12/16/2020   Bilateral impacted cerumen 02/08/2020   H/O heart artery stent 01/21/2018   Chronic ITP (idiopathic thrombocytopenia) (HCC) 07/10/2017   Thrombocytopenia (HCC) 07/10/2017   Presence of automatic (implantable) cardiac defibrillator 04/15/2017   CAD in native artery 01/29/2017   Dyslipidemia 01/29/2017   Old MI (myocardial infarction) 01/29/2017   Acquired hypothyroidism 03/14/2015   Exertional chest pain 03/14/2015   Atypical chest pain 03/14/2015   Encounter for screening for diabetes mellitus 09/20/2014   Essential hypertension 08/30/2014   Type 2 diabetes mellitus without complications (HCC) 08/30/2014      HPI: Randall Moses is a 88 y.o. male with ICM (ef 25%), chronic severe mr and tr, aaa s/p stent, copd, dm2, s/p aicd, chronic thrombocytopenia, referred here from primary care for management of presume IE/mssa bacteremia  I reviewed primary care chart and asked/received [name] recent admission. Appears he was dx'ed with mssa bacteremia on 11/28/2022 -- I do not have actual result. Tte done but tee not done as he didn't want; no mention in chart of aicd lead involvement. I am not sure if he was seen by id/cardiology   He also had several chf exacerbation visits and the last one seemed to be on 9/23 needing bipap for hypoxemic resp failure in setting volume overload. Volume controlled by discharge with iv furosemide  01/02/23 labs; cr 0.8; cbc 8.10/19/35 (chronic thrombocytopenia).  Cards had  recommended mitral clip for him and he'll continue f/u outpatient cardiology; next week visit and tee is pending. Aicd removal/replacement was discussed but it was decided he wasn't well enough to have it replaced  He is on cefazolin 2 gram every 8 hours; there is no issue with his right upper ext picc.  His daughter in law is here today and reported that he didn't see ID consultation there. She reports his platelet is in 60 yesterday. I reviewed labs on the phone from his daughter -- cbc cr 0.9; lft normal; cbc 5/10.7/69  He didn't remember when he was first dx'ed with endocarditis that he had any joint pain/back pain and still doesn't have now  He is tolerating cefazolin (no n/v/d/rash)  His appetite is normal now    Review of Systems: ROS All other ros negative      No past medical history on file. See above     No family history on file. 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   Physical Exam General/constitutional: no distress, pleasant HEENT: Normocephalic, PER, Conj Clear, EOMI, Oropharynx clear Neck supple CV: rrr no mrg; left chest icd site no tenderness/redness/fluctuance Lungs: clear to auscultation, normal respiratory effort Abd: Soft, Nontender Ext: no edema Skin: No Rash Neuro: nonfocal MSK: no peripheral joint swelling/tenderness/warmth; back spines nontender  Central line presence: rue picc site no erythema/purulence  Lab: See H&P for 10/8 opat labs  Microbiology:  Serology:  Imaging:   Assessment/plan: Problem List Items Addressed This Visit   None Visit Diagnoses     MSSA bacteremia    -  Primary   History of implantable cardioverter-defibrillator (ICD) placement        Ischemic cardiomyopathy       Relevant Medications   metoprolol succinate (TOPROL-XL) 25 MG 24 hr tablet   furosemide (LASIX) 20 MG tablet   nitroGLYCERIN (NITROSTAT) 0.4 MG SL tablet   sacubitril-valsartan (ENTRESTO) 24-26 MG        Hx scarlet fever and presumed tv/mv rheumatic heart disease Presumed IE/icd infection Onset 11/28/22 On 6 weeks cefazolin by now and will stop and transitioned to cefadroxil 1000 mg po bid  Not a candidate per previous cardiology discussion for aicd replacement  Doing well today's visit  Mitral valve regurg pending further cardiology evaluation for consideration mitral valve repair   Will plan to stop cefazolin after today Hh can remove picc after today  Start cefadroxil 1000 mg twice a day as above; plan is for indefinite antibiotics suppression or until the icd can be replaced  After 3-6 months potentially we can decrease the cefadroxil dose to 500 mg twice a day -- will be ongoing discussion   F/ cardiology  F/u with me in 4 weeks      Follow-up: Return in about 4 weeks (around 02/13/2023).  Raymondo Band, MD [facility] 01/16/2023, 3:04 PM

## 2023-01-18 DIAGNOSIS — I272 Pulmonary hypertension, unspecified: Secondary | ICD-10-CM | POA: Diagnosis not present

## 2023-01-18 DIAGNOSIS — J479 Bronchiectasis, uncomplicated: Secondary | ICD-10-CM | POA: Diagnosis not present

## 2023-01-18 DIAGNOSIS — E1165 Type 2 diabetes mellitus with hyperglycemia: Secondary | ICD-10-CM | POA: Diagnosis not present

## 2023-01-18 DIAGNOSIS — I2510 Atherosclerotic heart disease of native coronary artery without angina pectoris: Secondary | ICD-10-CM | POA: Diagnosis not present

## 2023-01-18 DIAGNOSIS — D696 Thrombocytopenia, unspecified: Secondary | ICD-10-CM | POA: Diagnosis not present

## 2023-01-18 DIAGNOSIS — I110 Hypertensive heart disease with heart failure: Secondary | ICD-10-CM | POA: Diagnosis not present

## 2023-01-22 DIAGNOSIS — J479 Bronchiectasis, uncomplicated: Secondary | ICD-10-CM | POA: Diagnosis not present

## 2023-01-22 DIAGNOSIS — I110 Hypertensive heart disease with heart failure: Secondary | ICD-10-CM | POA: Diagnosis not present

## 2023-01-22 DIAGNOSIS — E1165 Type 2 diabetes mellitus with hyperglycemia: Secondary | ICD-10-CM | POA: Diagnosis not present

## 2023-01-22 DIAGNOSIS — I2510 Atherosclerotic heart disease of native coronary artery without angina pectoris: Secondary | ICD-10-CM | POA: Diagnosis not present

## 2023-01-22 DIAGNOSIS — I272 Pulmonary hypertension, unspecified: Secondary | ICD-10-CM | POA: Diagnosis not present

## 2023-01-22 DIAGNOSIS — D696 Thrombocytopenia, unspecified: Secondary | ICD-10-CM | POA: Diagnosis not present

## 2023-01-23 DIAGNOSIS — J4530 Mild persistent asthma, uncomplicated: Secondary | ICD-10-CM | POA: Diagnosis not present

## 2023-01-23 DIAGNOSIS — I38 Endocarditis, valve unspecified: Secondary | ICD-10-CM | POA: Diagnosis not present

## 2023-01-23 DIAGNOSIS — J449 Chronic obstructive pulmonary disease, unspecified: Secondary | ICD-10-CM | POA: Diagnosis not present

## 2023-01-24 DIAGNOSIS — I5022 Chronic systolic (congestive) heart failure: Secondary | ICD-10-CM | POA: Diagnosis not present

## 2023-01-25 DIAGNOSIS — D696 Thrombocytopenia, unspecified: Secondary | ICD-10-CM | POA: Diagnosis not present

## 2023-01-25 DIAGNOSIS — I110 Hypertensive heart disease with heart failure: Secondary | ICD-10-CM | POA: Diagnosis not present

## 2023-01-25 DIAGNOSIS — I2510 Atherosclerotic heart disease of native coronary artery without angina pectoris: Secondary | ICD-10-CM | POA: Diagnosis not present

## 2023-01-25 DIAGNOSIS — E1165 Type 2 diabetes mellitus with hyperglycemia: Secondary | ICD-10-CM | POA: Diagnosis not present

## 2023-01-25 DIAGNOSIS — J479 Bronchiectasis, uncomplicated: Secondary | ICD-10-CM | POA: Diagnosis not present

## 2023-01-25 DIAGNOSIS — I272 Pulmonary hypertension, unspecified: Secondary | ICD-10-CM | POA: Diagnosis not present

## 2023-01-28 DIAGNOSIS — I340 Nonrheumatic mitral (valve) insufficiency: Secondary | ICD-10-CM | POA: Diagnosis not present

## 2023-01-28 DIAGNOSIS — Z8701 Personal history of pneumonia (recurrent): Secondary | ICD-10-CM | POA: Diagnosis not present

## 2023-01-28 DIAGNOSIS — Z91013 Allergy to seafood: Secondary | ICD-10-CM | POA: Diagnosis not present

## 2023-01-28 DIAGNOSIS — R0600 Dyspnea, unspecified: Secondary | ICD-10-CM | POA: Diagnosis not present

## 2023-01-28 DIAGNOSIS — M1990 Unspecified osteoarthritis, unspecified site: Secondary | ICD-10-CM | POA: Diagnosis not present

## 2023-01-28 DIAGNOSIS — R739 Hyperglycemia, unspecified: Secondary | ICD-10-CM | POA: Diagnosis not present

## 2023-01-28 DIAGNOSIS — J449 Chronic obstructive pulmonary disease, unspecified: Secondary | ICD-10-CM | POA: Diagnosis not present

## 2023-01-28 DIAGNOSIS — F32A Depression, unspecified: Secondary | ICD-10-CM | POA: Diagnosis not present

## 2023-01-28 DIAGNOSIS — J9601 Acute respiratory failure with hypoxia: Secondary | ICD-10-CM | POA: Diagnosis not present

## 2023-01-28 DIAGNOSIS — R9431 Abnormal electrocardiogram [ECG] [EKG]: Secondary | ICD-10-CM | POA: Diagnosis not present

## 2023-01-28 DIAGNOSIS — Z7984 Long term (current) use of oral hypoglycemic drugs: Secondary | ICD-10-CM | POA: Diagnosis not present

## 2023-01-28 DIAGNOSIS — Z79899 Other long term (current) drug therapy: Secondary | ICD-10-CM | POA: Diagnosis not present

## 2023-01-28 DIAGNOSIS — I5023 Acute on chronic systolic (congestive) heart failure: Secondary | ICD-10-CM | POA: Diagnosis not present

## 2023-01-28 DIAGNOSIS — J189 Pneumonia, unspecified organism: Secondary | ICD-10-CM | POA: Diagnosis not present

## 2023-01-28 DIAGNOSIS — J9620 Acute and chronic respiratory failure, unspecified whether with hypoxia or hypercapnia: Secondary | ICD-10-CM | POA: Diagnosis not present

## 2023-01-28 DIAGNOSIS — J984 Other disorders of lung: Secondary | ICD-10-CM | POA: Diagnosis not present

## 2023-01-28 DIAGNOSIS — Z87891 Personal history of nicotine dependence: Secondary | ICD-10-CM | POA: Diagnosis not present

## 2023-01-28 DIAGNOSIS — Z950 Presence of cardiac pacemaker: Secondary | ICD-10-CM | POA: Diagnosis not present

## 2023-01-28 DIAGNOSIS — J90 Pleural effusion, not elsewhere classified: Secondary | ICD-10-CM | POA: Diagnosis not present

## 2023-01-28 DIAGNOSIS — Z792 Long term (current) use of antibiotics: Secondary | ICD-10-CM | POA: Diagnosis not present

## 2023-01-28 DIAGNOSIS — J9690 Respiratory failure, unspecified, unspecified whether with hypoxia or hypercapnia: Secondary | ICD-10-CM | POA: Diagnosis not present

## 2023-01-28 DIAGNOSIS — E039 Hypothyroidism, unspecified: Secondary | ICD-10-CM | POA: Diagnosis not present

## 2023-01-28 DIAGNOSIS — D696 Thrombocytopenia, unspecified: Secondary | ICD-10-CM | POA: Diagnosis not present

## 2023-01-28 DIAGNOSIS — Z794 Long term (current) use of insulin: Secondary | ICD-10-CM | POA: Diagnosis not present

## 2023-01-28 DIAGNOSIS — E162 Hypoglycemia, unspecified: Secondary | ICD-10-CM | POA: Diagnosis not present

## 2023-01-28 DIAGNOSIS — I444 Left anterior fascicular block: Secondary | ICD-10-CM | POA: Diagnosis not present

## 2023-01-28 DIAGNOSIS — I255 Ischemic cardiomyopathy: Secondary | ICD-10-CM | POA: Diagnosis not present

## 2023-01-28 DIAGNOSIS — E11649 Type 2 diabetes mellitus with hypoglycemia without coma: Secondary | ICD-10-CM | POA: Diagnosis not present

## 2023-01-28 DIAGNOSIS — R231 Pallor: Secondary | ICD-10-CM | POA: Diagnosis not present

## 2023-01-28 DIAGNOSIS — R404 Transient alteration of awareness: Secondary | ICD-10-CM | POA: Diagnosis not present

## 2023-01-28 DIAGNOSIS — I10 Essential (primary) hypertension: Secondary | ICD-10-CM | POA: Diagnosis not present

## 2023-01-28 DIAGNOSIS — I110 Hypertensive heart disease with heart failure: Secondary | ICD-10-CM | POA: Diagnosis not present

## 2023-01-28 DIAGNOSIS — I447 Left bundle-branch block, unspecified: Secondary | ICD-10-CM | POA: Diagnosis not present

## 2023-01-28 DIAGNOSIS — Z7951 Long term (current) use of inhaled steroids: Secondary | ICD-10-CM | POA: Diagnosis not present

## 2023-01-28 DIAGNOSIS — R918 Other nonspecific abnormal finding of lung field: Secondary | ICD-10-CM | POA: Diagnosis not present

## 2023-01-28 DIAGNOSIS — R0602 Shortness of breath: Secondary | ICD-10-CM | POA: Diagnosis not present

## 2023-01-28 DIAGNOSIS — I509 Heart failure, unspecified: Secondary | ICD-10-CM | POA: Diagnosis not present

## 2023-01-28 DIAGNOSIS — E785 Hyperlipidemia, unspecified: Secondary | ICD-10-CM | POA: Diagnosis not present

## 2023-01-28 DIAGNOSIS — R0902 Hypoxemia: Secondary | ICD-10-CM | POA: Diagnosis not present

## 2023-01-28 DIAGNOSIS — I252 Old myocardial infarction: Secondary | ICD-10-CM | POA: Diagnosis not present

## 2023-01-28 DIAGNOSIS — I2510 Atherosclerotic heart disease of native coronary artery without angina pectoris: Secondary | ICD-10-CM | POA: Diagnosis not present

## 2023-01-29 DIAGNOSIS — I509 Heart failure, unspecified: Secondary | ICD-10-CM | POA: Diagnosis not present

## 2023-01-29 DIAGNOSIS — I340 Nonrheumatic mitral (valve) insufficiency: Secondary | ICD-10-CM | POA: Diagnosis not present

## 2023-01-31 DIAGNOSIS — I5022 Chronic systolic (congestive) heart failure: Secondary | ICD-10-CM | POA: Diagnosis not present

## 2023-01-31 DIAGNOSIS — E039 Hypothyroidism, unspecified: Secondary | ICD-10-CM | POA: Diagnosis not present

## 2023-01-31 DIAGNOSIS — D638 Anemia in other chronic diseases classified elsewhere: Secondary | ICD-10-CM | POA: Diagnosis not present

## 2023-01-31 DIAGNOSIS — E119 Type 2 diabetes mellitus without complications: Secondary | ICD-10-CM | POA: Diagnosis not present

## 2023-01-31 DIAGNOSIS — Z95810 Presence of automatic (implantable) cardiac defibrillator: Secondary | ICD-10-CM | POA: Diagnosis not present

## 2023-01-31 DIAGNOSIS — I110 Hypertensive heart disease with heart failure: Secondary | ICD-10-CM | POA: Diagnosis not present

## 2023-01-31 DIAGNOSIS — I2510 Atherosclerotic heart disease of native coronary artery without angina pectoris: Secondary | ICD-10-CM | POA: Diagnosis not present

## 2023-01-31 DIAGNOSIS — Z955 Presence of coronary angioplasty implant and graft: Secondary | ICD-10-CM | POA: Diagnosis not present

## 2023-01-31 DIAGNOSIS — E785 Hyperlipidemia, unspecified: Secondary | ICD-10-CM | POA: Diagnosis not present

## 2023-01-31 DIAGNOSIS — I252 Old myocardial infarction: Secondary | ICD-10-CM | POA: Diagnosis not present

## 2023-02-01 DIAGNOSIS — J479 Bronchiectasis, uncomplicated: Secondary | ICD-10-CM | POA: Diagnosis not present

## 2023-02-01 DIAGNOSIS — I110 Hypertensive heart disease with heart failure: Secondary | ICD-10-CM | POA: Diagnosis not present

## 2023-02-01 DIAGNOSIS — I2510 Atherosclerotic heart disease of native coronary artery without angina pectoris: Secondary | ICD-10-CM | POA: Diagnosis not present

## 2023-02-01 DIAGNOSIS — E1165 Type 2 diabetes mellitus with hyperglycemia: Secondary | ICD-10-CM | POA: Diagnosis not present

## 2023-02-01 DIAGNOSIS — I272 Pulmonary hypertension, unspecified: Secondary | ICD-10-CM | POA: Diagnosis not present

## 2023-02-01 DIAGNOSIS — D696 Thrombocytopenia, unspecified: Secondary | ICD-10-CM | POA: Diagnosis not present

## 2023-02-04 DIAGNOSIS — I509 Heart failure, unspecified: Secondary | ICD-10-CM | POA: Diagnosis not present

## 2023-02-04 DIAGNOSIS — Z6820 Body mass index (BMI) 20.0-20.9, adult: Secondary | ICD-10-CM | POA: Diagnosis not present

## 2023-02-05 DIAGNOSIS — Z87440 Personal history of urinary (tract) infections: Secondary | ICD-10-CM | POA: Diagnosis not present

## 2023-02-05 DIAGNOSIS — Z7985 Long-term (current) use of injectable non-insulin antidiabetic drugs: Secondary | ICD-10-CM | POA: Diagnosis not present

## 2023-02-05 DIAGNOSIS — K5730 Diverticulosis of large intestine without perforation or abscess without bleeding: Secondary | ICD-10-CM | POA: Diagnosis not present

## 2023-02-05 DIAGNOSIS — F1090 Alcohol use, unspecified, uncomplicated: Secondary | ICD-10-CM | POA: Diagnosis not present

## 2023-02-05 DIAGNOSIS — I2510 Atherosclerotic heart disease of native coronary artery without angina pectoris: Secondary | ICD-10-CM | POA: Diagnosis not present

## 2023-02-05 DIAGNOSIS — Z9181 History of falling: Secondary | ICD-10-CM | POA: Diagnosis not present

## 2023-02-05 DIAGNOSIS — Z87891 Personal history of nicotine dependence: Secondary | ICD-10-CM | POA: Diagnosis not present

## 2023-02-05 DIAGNOSIS — I5022 Chronic systolic (congestive) heart failure: Secondary | ICD-10-CM | POA: Diagnosis not present

## 2023-02-05 DIAGNOSIS — E782 Mixed hyperlipidemia: Secondary | ICD-10-CM | POA: Diagnosis not present

## 2023-02-05 DIAGNOSIS — I083 Combined rheumatic disorders of mitral, aortic and tricuspid valves: Secondary | ICD-10-CM | POA: Diagnosis not present

## 2023-02-05 DIAGNOSIS — Z794 Long term (current) use of insulin: Secondary | ICD-10-CM | POA: Diagnosis not present

## 2023-02-05 DIAGNOSIS — Z8701 Personal history of pneumonia (recurrent): Secondary | ICD-10-CM | POA: Diagnosis not present

## 2023-02-05 DIAGNOSIS — Z95810 Presence of automatic (implantable) cardiac defibrillator: Secondary | ICD-10-CM | POA: Diagnosis not present

## 2023-02-05 DIAGNOSIS — E1165 Type 2 diabetes mellitus with hyperglycemia: Secondary | ICD-10-CM | POA: Diagnosis not present

## 2023-02-05 DIAGNOSIS — I255 Ischemic cardiomyopathy: Secondary | ICD-10-CM | POA: Diagnosis not present

## 2023-02-05 DIAGNOSIS — Z7951 Long term (current) use of inhaled steroids: Secondary | ICD-10-CM | POA: Diagnosis not present

## 2023-02-05 DIAGNOSIS — I272 Pulmonary hypertension, unspecified: Secondary | ICD-10-CM | POA: Diagnosis not present

## 2023-02-05 DIAGNOSIS — I5023 Acute on chronic systolic (congestive) heart failure: Secondary | ICD-10-CM | POA: Diagnosis not present

## 2023-02-05 DIAGNOSIS — I252 Old myocardial infarction: Secondary | ICD-10-CM | POA: Diagnosis not present

## 2023-02-05 DIAGNOSIS — Z792 Long term (current) use of antibiotics: Secondary | ICD-10-CM | POA: Diagnosis not present

## 2023-02-05 DIAGNOSIS — Z4502 Encounter for adjustment and management of automatic implantable cardiac defibrillator: Secondary | ICD-10-CM | POA: Diagnosis not present

## 2023-02-05 DIAGNOSIS — D696 Thrombocytopenia, unspecified: Secondary | ICD-10-CM | POA: Diagnosis not present

## 2023-02-05 DIAGNOSIS — E039 Hypothyroidism, unspecified: Secondary | ICD-10-CM | POA: Diagnosis not present

## 2023-02-05 DIAGNOSIS — I110 Hypertensive heart disease with heart failure: Secondary | ICD-10-CM | POA: Diagnosis not present

## 2023-02-05 DIAGNOSIS — J479 Bronchiectasis, uncomplicated: Secondary | ICD-10-CM | POA: Diagnosis not present

## 2023-02-05 DIAGNOSIS — D638 Anemia in other chronic diseases classified elsewhere: Secondary | ICD-10-CM | POA: Diagnosis not present

## 2023-02-05 DIAGNOSIS — Z7984 Long term (current) use of oral hypoglycemic drugs: Secondary | ICD-10-CM | POA: Diagnosis not present

## 2023-02-07 DIAGNOSIS — I110 Hypertensive heart disease with heart failure: Secondary | ICD-10-CM | POA: Diagnosis not present

## 2023-02-07 DIAGNOSIS — J479 Bronchiectasis, uncomplicated: Secondary | ICD-10-CM | POA: Diagnosis not present

## 2023-02-07 DIAGNOSIS — E1165 Type 2 diabetes mellitus with hyperglycemia: Secondary | ICD-10-CM | POA: Diagnosis not present

## 2023-02-07 DIAGNOSIS — D696 Thrombocytopenia, unspecified: Secondary | ICD-10-CM | POA: Diagnosis not present

## 2023-02-07 DIAGNOSIS — I272 Pulmonary hypertension, unspecified: Secondary | ICD-10-CM | POA: Diagnosis not present

## 2023-02-07 DIAGNOSIS — I5023 Acute on chronic systolic (congestive) heart failure: Secondary | ICD-10-CM | POA: Diagnosis not present

## 2023-02-11 DIAGNOSIS — R002 Palpitations: Secondary | ICD-10-CM | POA: Diagnosis not present

## 2023-02-11 DIAGNOSIS — I5022 Chronic systolic (congestive) heart failure: Secondary | ICD-10-CM | POA: Diagnosis not present

## 2023-02-11 DIAGNOSIS — I5032 Chronic diastolic (congestive) heart failure: Secondary | ICD-10-CM | POA: Diagnosis not present

## 2023-02-11 DIAGNOSIS — Z4502 Encounter for adjustment and management of automatic implantable cardiac defibrillator: Secondary | ICD-10-CM | POA: Diagnosis not present

## 2023-02-12 DIAGNOSIS — E1165 Type 2 diabetes mellitus with hyperglycemia: Secondary | ICD-10-CM | POA: Diagnosis not present

## 2023-02-12 DIAGNOSIS — D696 Thrombocytopenia, unspecified: Secondary | ICD-10-CM | POA: Diagnosis not present

## 2023-02-12 DIAGNOSIS — I272 Pulmonary hypertension, unspecified: Secondary | ICD-10-CM | POA: Diagnosis not present

## 2023-02-12 DIAGNOSIS — I5023 Acute on chronic systolic (congestive) heart failure: Secondary | ICD-10-CM | POA: Diagnosis not present

## 2023-02-12 DIAGNOSIS — J479 Bronchiectasis, uncomplicated: Secondary | ICD-10-CM | POA: Diagnosis not present

## 2023-02-12 DIAGNOSIS — I110 Hypertensive heart disease with heart failure: Secondary | ICD-10-CM | POA: Diagnosis not present

## 2023-02-13 DIAGNOSIS — E119 Type 2 diabetes mellitus without complications: Secondary | ICD-10-CM | POA: Diagnosis not present

## 2023-02-13 DIAGNOSIS — I110 Hypertensive heart disease with heart failure: Secondary | ICD-10-CM | POA: Diagnosis not present

## 2023-02-13 DIAGNOSIS — I255 Ischemic cardiomyopathy: Secondary | ICD-10-CM | POA: Diagnosis not present

## 2023-02-13 DIAGNOSIS — Z79899 Other long term (current) drug therapy: Secondary | ICD-10-CM | POA: Diagnosis not present

## 2023-02-13 DIAGNOSIS — I509 Heart failure, unspecified: Secondary | ICD-10-CM | POA: Diagnosis not present

## 2023-02-13 DIAGNOSIS — Z794 Long term (current) use of insulin: Secondary | ICD-10-CM | POA: Diagnosis not present

## 2023-02-14 ENCOUNTER — Encounter: Payer: Self-pay | Admitting: Internal Medicine

## 2023-02-14 ENCOUNTER — Ambulatory Visit (HOSPITAL_COMMUNITY)
Admission: RE | Admit: 2023-02-14 | Discharge: 2023-02-14 | Disposition: A | Discharge status: Home / Self Care | Payer: Medicare Other | Source: Ambulatory Visit | Attending: Cardiology | Admitting: Cardiology

## 2023-02-14 ENCOUNTER — Encounter (HOSPITAL_COMMUNITY): Payer: Self-pay | Admitting: Cardiology

## 2023-02-14 ENCOUNTER — Other Ambulatory Visit: Payer: Self-pay

## 2023-02-14 ENCOUNTER — Ambulatory Visit (INDEPENDENT_AMBULATORY_CARE_PROVIDER_SITE_OTHER): Payer: Medicare Other | Admitting: Internal Medicine

## 2023-02-14 VITALS — BP 100/60 | HR 68 | Wt 132.6 lb

## 2023-02-14 VITALS — BP 98/61 | HR 72 | Temp 98.1°F | Resp 16 | Wt 132.0 lb

## 2023-02-14 DIAGNOSIS — Z955 Presence of coronary angioplasty implant and graft: Secondary | ICD-10-CM | POA: Insufficient documentation

## 2023-02-14 DIAGNOSIS — I5022 Chronic systolic (congestive) heart failure: Secondary | ICD-10-CM | POA: Insufficient documentation

## 2023-02-14 DIAGNOSIS — Z7984 Long term (current) use of oral hypoglycemic drugs: Secondary | ICD-10-CM | POA: Diagnosis not present

## 2023-02-14 DIAGNOSIS — D693 Immune thrombocytopenic purpura: Secondary | ICD-10-CM | POA: Diagnosis not present

## 2023-02-14 DIAGNOSIS — Z95810 Presence of automatic (implantable) cardiac defibrillator: Secondary | ICD-10-CM | POA: Diagnosis not present

## 2023-02-14 DIAGNOSIS — I255 Ischemic cardiomyopathy: Secondary | ICD-10-CM | POA: Diagnosis not present

## 2023-02-14 DIAGNOSIS — I25119 Atherosclerotic heart disease of native coronary artery with unspecified angina pectoris: Secondary | ICD-10-CM | POA: Insufficient documentation

## 2023-02-14 DIAGNOSIS — I5042 Chronic combined systolic (congestive) and diastolic (congestive) heart failure: Secondary | ICD-10-CM | POA: Diagnosis not present

## 2023-02-14 DIAGNOSIS — Z8679 Personal history of other diseases of the circulatory system: Secondary | ICD-10-CM | POA: Diagnosis not present

## 2023-02-14 DIAGNOSIS — R9431 Abnormal electrocardiogram [ECG] [EKG]: Secondary | ICD-10-CM | POA: Diagnosis not present

## 2023-02-14 DIAGNOSIS — I5043 Acute on chronic combined systolic (congestive) and diastolic (congestive) heart failure: Secondary | ICD-10-CM | POA: Diagnosis not present

## 2023-02-14 DIAGNOSIS — I252 Old myocardial infarction: Secondary | ICD-10-CM | POA: Insufficient documentation

## 2023-02-14 DIAGNOSIS — Z79899 Other long term (current) drug therapy: Secondary | ICD-10-CM | POA: Diagnosis not present

## 2023-02-14 DIAGNOSIS — R7881 Bacteremia: Secondary | ICD-10-CM

## 2023-02-14 DIAGNOSIS — E119 Type 2 diabetes mellitus without complications: Secondary | ICD-10-CM | POA: Insufficient documentation

## 2023-02-14 NOTE — Patient Instructions (Addendum)
Medication Changes:  No Changes In Medications at this time.   Your provider has recommended a HFMS (heart failure monitoring system) device for you. This device is an external patch that you wear on your left side for up to 3 months. It monitors and alerts [place] if there are changes in your fluid levels. Once approved by your insurance company the [place] company will reach out to you and ship the device. They will walk you through the placement process and answer any questions you have.  If your fluid level increases our office is notified. We will then reach out to you for further instructions.  Follow-Up in: 3 months PLEASE CALL OUR OFFICE AROUND DECEMBER TO GET SCHEDULED FOR YOUR APPOINTMENT. PHONE NUMBER IS [phone] OPTION 2   At the [facility], you and your health needs are our priority. We have a designated team specialized in the treatment of Heart Failure. This Care Team includes your primary Heart Failure Specialized Cardiologist (physician), Advanced Practice Providers (APPs- Physician Assistants and Nurse Practitioners), and Pharmacist who all work together to provide you with the care you need, when you need it.   You may see any of the following providers on your designated Care Team at your next follow up:  Dr. Arvilla Meres Dr. Marca Ancona Dr. Dorthula Nettles Dr. Theresia Bough Tonye Becket, NP Robbie Lis, [place] [facility] [place], [place] Brynda Peon, NP [place] Lee, NP Karle Plumber, PharmD   Please be sure to bring in all your medications bottles to every appointment.   Need to Contact us:  If you have any questions or concerns before your next appointment please send [place] a message through [place] or call our office at [phone].    TO LEAVE A MESSAGE FOR THE NURSE SELECT OPTION 2, PLEASE LEAVE A MESSAGE INCLUDING: YOUR NAME DATE OF BIRTH CALL BACK NUMBER REASON FOR CALL**this is important as we prioritize the call backs  YOU WILL  RECEIVE A CALL BACK THE SAME DAY AS LONG AS YOU CALL BEFORE 4:00 PM

## 2023-02-14 NOTE — Progress Notes (Signed)
[facility]  Cc- f/u mssa bacteremia    Patient Active Problem List   Diagnosis Date Noted   Sepsis (HCC) 01/03/2023   Bacteremia 12/11/2022   Thyroid disease 10/10/2022   Diabetes mellitus (HCC) 10/10/2022   Acute on chronic combined systolic and diastolic heart failure (HCC) 08/22/2022   Hoarse 05/23/2022   Fatigue 11/02/2021   Anemia of chronic disease 12/16/2020   Bilateral impacted cerumen 02/08/2020   H/O heart artery stent 01/21/2018   Chronic ITP (idiopathic thrombocytopenia) (HCC) 07/10/2017   Thrombocytopenia (HCC) 07/10/2017   Presence of automatic (implantable) cardiac defibrillator 04/15/2017   CAD in native artery 01/29/2017   Dyslipidemia 01/29/2017   Old MI (myocardial infarction) 01/29/2017   Acquired hypothyroidism 03/14/2015   Exertional chest pain 03/14/2015   Atypical chest pain 03/14/2015   Encounter for screening for diabetes mellitus 09/20/2014   Essential hypertension 08/30/2014   Type 2 diabetes mellitus without complications (HCC) 08/30/2014      HPI: Randall Moses is a 88 y.o. male with ICM (ef 25%), chronic severe mr and tr, aaa s/p stent, copd, dm2, s/p aicd, chronic thrombocytopenia, referred here from primary care for management of presume IE/mssa bacteremia  I reviewed primary care chart and asked/received [facility] recent admission. Appears he was dx'ed with mssa bacteremia on 11/28/2022 -- I do not have actual result. Tte done but tee not done as he didn't want; no mention in chart of aicd lead involvement. I am not sure if he was seen by id/cardiology   He also had several chf exacerbation visits and the last one seemed to be on 9/23 needing bipap for hypoxemic resp failure in setting volume overload. Volume controlled by discharge with iv furosemide  01/02/23 labs; cr 0.8; cbc 8.10/19/35 (chronic thrombocytopenia).  Cards had recommended mitral clip for him and he'll continue f/u outpatient  cardiology; next week visit and tee is pending. Aicd removal/replacement was discussed but it was decided he wasn't well enough to have it replaced  He is on cefazolin 2 gram every 8 hours; there is no issue with his right upper ext picc.  His daughter in law is here today and reported that he didn't see ID consultation there. She reports his platelet is in 60 yesterday. I reviewed labs on the phone from his daughter -- cbc cr 0.9; lft normal; cbc 5/10.7/69  He didn't remember when he was first dx'ed with endocarditis that he had any joint pain/back pain and still doesn't have now  He is tolerating cefazolin (no n/v/d/rash)  His appetite is normal now   -------------- 02/14/23 id clinic f/u See a&P for detail    Review of Systems: ROS All other ros negative      Past Medical History:  Diagnosis Date   Allergy    CHF (congestive heart failure) (HCC)    Diabetes mellitus without complication (HCC)    See above  Social History   Tobacco Use   Smoking status: Former    Types: Cigarettes   Smokeless tobacco: Never  Substance Use Topics   Alcohol use: Not Currently   Drug use: Not Currently    No family history on file. 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   Physical Exam General/constitutional: no distress, pleasant HEENT: Normocephalic, PER, Conj Clear, EOMI, Oropharynx clear Neck supple CV: rrr no mrg; left chest icd site no tenderness/redness/fluctuance Lungs: clear to auscultation, normal respiratory effort Abd: Soft, Nontender Ext: no edema Skin: No Rash Neuro: nonfocal MSK: no peripheral joint  swelling/tenderness/warmth; back spines nontender   Central line presence: rue picc site no erythema/purulence  Lab: See H&P for 10/8 opat labs    Microbiology:  Serology:  Imaging:   Assessment/plan: Problem List Items Addressed This Visit       Other   Bacteremia - Primary   Presence of automatic (implantable) cardiac defibrillator      Hx scarlet fever and presumed tv/mv rheumatic heart disease Presumed IE/icd infection Onset 11/28/22 On 6 weeks cefazolin by now and will stop and transitioned to cefadroxil 1000 mg po bid  Not a candidate per previous cardiology discussion for aicd replacement  Doing well today's visit  Mitral valve regurg pending further cardiology evaluation for consideration mitral valve repair   Will plan to stop cefazolin after today Hh can remove picc after today  Start cefadroxil 1000 mg twice a day as above; plan is for indefinite antibiotics suppression or until the icd can be replaced  After 3-6 months potentially we can decrease the cefadroxil dose to 500 mg twice a day -- will be ongoing discussion   F/ cardiology  F/u with me in 4 weeks   ------------ 02/14/23 id assessment He had [facility] admission recently for chf exacerbation He saw cardiology at [facility] 02/13/23 for chf management and I reviewed their note from care every where No tee yet He'll see advance heart failure today  He is tolerating cefadroxil but he is only taking 500 mg twice a day No fever, chill, n/v/diarrhea No focal joint/back pain  Will await advance heart failure evaluation. If the device was to remain in then will keep on cefadorxil 500 mg twice a day indefinitely  At this time doesn't appear to have any breakthrough infection  Labs reviewed from care every where and no need to do today  F/u 6-8 weeks video visit   Follow-up: Return in about 8 weeks (around 04/11/2023).  Raymondo Band, MD [facility] 02/14/2023, 9:57 AM

## 2023-02-14 NOTE — Patient Instructions (Signed)
You can have cbc/cmp on your next visit (or with cardiology team)   See me in 6-8 weeks   As I suspect the advance heart failure team could recommend keeping current icd, we plan to keep you on cefadroxil 500 mg twice a day indefinitely

## 2023-02-15 DIAGNOSIS — I272 Pulmonary hypertension, unspecified: Secondary | ICD-10-CM | POA: Diagnosis not present

## 2023-02-15 DIAGNOSIS — I110 Hypertensive heart disease with heart failure: Secondary | ICD-10-CM | POA: Diagnosis not present

## 2023-02-15 DIAGNOSIS — D696 Thrombocytopenia, unspecified: Secondary | ICD-10-CM | POA: Diagnosis not present

## 2023-02-15 DIAGNOSIS — I5023 Acute on chronic systolic (congestive) heart failure: Secondary | ICD-10-CM | POA: Diagnosis not present

## 2023-02-15 DIAGNOSIS — J479 Bronchiectasis, uncomplicated: Secondary | ICD-10-CM | POA: Diagnosis not present

## 2023-02-15 DIAGNOSIS — E1165 Type 2 diabetes mellitus with hyperglycemia: Secondary | ICD-10-CM | POA: Diagnosis not present

## 2023-02-15 NOTE — Progress Notes (Signed)
ADVANCED [facility] FAILURE NEW PATIENT CLINIC NOTE  Referring Physician: Marylen Ponto, MD  Primary Care: Randall Ponto, MD Primary Cardiologist:  HPI: Randall Moses is a 88 y.o. male with a PMH of ischemic cardiomyopathy, heart failure with reduced ejection fraction, chronic ITP, prior history of endocarditis, diabetes, AAA prior stent placement, CRT-D, and multiple recent hospitalizations who presents for initial visit for further evaluation and treatment of heart failure/cardiomyopathy.      Patient presents with known ischemic cardiomyopathy, CRT-D for a second opinion given multiple recent hospitalizations over the last few months.  The patient has been hospitalized multiple times for acute on chronic hypoxic respiratory failure and systolic heart failure.  He was required positive pressure ventilation and IV diuretics during multiple of these recent episodes.  He was also hospitalized for sepsis with resulting hypotension requiring Levophed.  He was placed on IV antibiotics and these were empirically continued for a reported diagnosis of endocarditis.  He did grow out MSSA bacteremia.  Likely given his age and history of heart failure they elected to continue oral suppressive antibiotics versus ICD explantation.  He has a history of anterior MI with PCI in 2007, this was noted to be patent by heart catheterization in 2019.  His last stress test in 2023 showed multiple fixed defects in the inferior, septal, and inferoseptal wall without any evidence of reversibility.     SUBJECTIVE: Patient is well-appearing and appears compensated on today's exam.  He is accompanied by his son and daughter-in-law.  They all expressed frustration at the multiple recent hospitalizations.  Per patient and family, his decompensation is usually rather abrupt.  He usually starts to feel bad in the middle of the night, expressing a "gurgling" sensation which usually signifies that he has extra fluid on board.  They  deny any abdominal distention or significant lower extremity edema that proceeds this decompensation.  His medications have been adjusted many times without improvement.  When he does not in the hospital he continues to be fairly active is able to complete his ADLs.  He evidently had problems with his coronary sinus lead but this was addressed back in January.  Reportedly had worsening mitral valve disease but I do not have access to this echocardiogram.  PMH, current medications, allergies, social history, and family history reviewed in epic.  PHYSICAL EXAM: Vitals:   02/14/23 1138  BP: 100/60  Pulse: 68  SpO2: 99%   GENERAL: Well nourished and in no apparent distress at rest.  HEENT: The mucous membranes are pink and moist.   PULM: There are no chest wall deformities. There is no chest wall tenderness. Normal work of breathing, clear to auscultation bilaterally. Respirations are unlabored.  CARDIAC:  JVP: Not elevated         .  Normal rate and rhythm, systolic murmur, trace lower extremity edema ABDOMEN: Soft, non-tender, non-distended. NEUROLOGIC: Patient is oriented x3 with no focal or lateralizing neurologic deficits.  PSYCH: Patients affect is appropriate, there is no evidence of anxiety or depression.  SKIN: Warm and dry; no lesions or wounds. Warm and well perfused extremities.  DATA REVIEW  ECG: 02/2023: AV dual chamber pacemaker with LV lead  ECHO: 07/2022: Moderately dilated LV with LVEF 15 to 20%.  Left bundle branch block septal motion.  Severe diastolic dysfunction with elevated left atrial pressure.  Moderate pulmonary hypertension with moderate RV systolic dysfunction.  CATH: 07/2017: 40% LAD lesion with mild in-stent restenosis of previous LAD stent, otherwise noted to  have no evidence of obstructive disease   Heart failure review: - Classification: Heart failure with reduced EF - Etiology: Ischemic - NYHA Class: III - Volume status: Euvolemic - ACEi/ARB/ARNI:  Currently up-titrating - Aldosterone antagonist: Plan to start at a subsequent visit - Beta-blocker: Currently up-titrating - Digoxin: Maximally tolerated dose - Hydralazine/Nitrates: Not a candidate - SGLT2i: Maximally tolerated dose - GLP-1: Not a candidate - Advanced therapies: Not a candidate - ICD: Already in place   ASSESSMENT & PLAN:  Chronic systolic heart failure: Patient with multiple recent hospitalizations over the past few months despite medical therapy.  He looks remarkably well compensated with preserved renal function despite these events.  His decompensation happens abruptly without much advance warning, weight gain, or additional symptoms.  We discussed outpatient monitoring including cardio mems and ZOLL HFMS device.  He and his family would like to try the noninvasive system at this time in the hopes that it may allow for adjustment of diuretics prior to hospitalization.  His BP does not allow for aggressive titration of medical therapy, and reportedly his CRT-D is now functioning. -ZOLL HFMS system ordered -Continue current diuretic regimen -Continue Entresto 24/26 mg 1/2 tab BID -Continue metoprolol 25 mg daily -Continue Farxiga 10 mg daily -Encouraged to follow-up on sleep study as this may be contributing -Patient does note occasional angina with moderate exertion, consider repeat heart catheterization if he does not respond  CAD: Moderate, nonlimiting angina that responds to nitroglycerin.  Last stress test in 2023 with multiple fixed defects. -Could consider coronary angiography if recurrent admissions  Diabetes: Managed by outpatient endocrinology. -Continue Farxiga  Endocarditis: Patient on chronic cefadroxil for history of presumed endocarditis though did not have a TEE.  Obtaining TEE now is likely of limited clinical benefit given that he is already completed antibiotic therapy.  If bacteremia or recurrent would likely require device explant.     Clearnce Hasten, MD [facility] Mechanical Circulatory Support 02/15/23

## 2023-02-18 DIAGNOSIS — I110 Hypertensive heart disease with heart failure: Secondary | ICD-10-CM | POA: Diagnosis not present

## 2023-02-19 DIAGNOSIS — D696 Thrombocytopenia, unspecified: Secondary | ICD-10-CM | POA: Diagnosis not present

## 2023-02-19 DIAGNOSIS — E1165 Type 2 diabetes mellitus with hyperglycemia: Secondary | ICD-10-CM | POA: Diagnosis not present

## 2023-02-19 DIAGNOSIS — J479 Bronchiectasis, uncomplicated: Secondary | ICD-10-CM | POA: Diagnosis not present

## 2023-02-19 DIAGNOSIS — I5023 Acute on chronic systolic (congestive) heart failure: Secondary | ICD-10-CM | POA: Diagnosis not present

## 2023-02-19 DIAGNOSIS — I272 Pulmonary hypertension, unspecified: Secondary | ICD-10-CM | POA: Diagnosis not present

## 2023-02-19 DIAGNOSIS — I110 Hypertensive heart disease with heart failure: Secondary | ICD-10-CM | POA: Diagnosis not present

## 2023-02-20 DIAGNOSIS — I38 Endocarditis, valve unspecified: Secondary | ICD-10-CM | POA: Diagnosis not present

## 2023-02-20 DIAGNOSIS — J4530 Mild persistent asthma, uncomplicated: Secondary | ICD-10-CM | POA: Diagnosis not present

## 2023-02-20 DIAGNOSIS — G4733 Obstructive sleep apnea (adult) (pediatric): Secondary | ICD-10-CM | POA: Diagnosis not present

## 2023-02-22 DIAGNOSIS — E1165 Type 2 diabetes mellitus with hyperglycemia: Secondary | ICD-10-CM | POA: Diagnosis not present

## 2023-02-22 DIAGNOSIS — J479 Bronchiectasis, uncomplicated: Secondary | ICD-10-CM | POA: Diagnosis not present

## 2023-02-22 DIAGNOSIS — D696 Thrombocytopenia, unspecified: Secondary | ICD-10-CM | POA: Diagnosis not present

## 2023-02-22 DIAGNOSIS — I110 Hypertensive heart disease with heart failure: Secondary | ICD-10-CM | POA: Diagnosis not present

## 2023-02-22 DIAGNOSIS — I272 Pulmonary hypertension, unspecified: Secondary | ICD-10-CM | POA: Diagnosis not present

## 2023-02-22 DIAGNOSIS — I5023 Acute on chronic systolic (congestive) heart failure: Secondary | ICD-10-CM | POA: Diagnosis not present

## 2023-02-25 DIAGNOSIS — R0602 Shortness of breath: Secondary | ICD-10-CM | POA: Diagnosis not present

## 2023-02-25 DIAGNOSIS — Z950 Presence of cardiac pacemaker: Secondary | ICD-10-CM | POA: Diagnosis not present

## 2023-02-26 ENCOUNTER — Telehealth (HOSPITAL_COMMUNITY): Payer: Self-pay | Admitting: Cardiology

## 2023-02-26 NOTE — Telephone Encounter (Signed)
Pts daughter called to report patient will not be able to enroll in zoll patch program, was told pt must live in a [employer] cell phone service area.   Will return equipment to zoll reps  Message to provider as Lorain Childes

## 2023-02-27 DIAGNOSIS — D696 Thrombocytopenia, unspecified: Secondary | ICD-10-CM | POA: Diagnosis not present

## 2023-02-27 DIAGNOSIS — I110 Hypertensive heart disease with heart failure: Secondary | ICD-10-CM | POA: Diagnosis not present

## 2023-02-27 DIAGNOSIS — Z23 Encounter for immunization: Secondary | ICD-10-CM | POA: Diagnosis not present

## 2023-02-27 DIAGNOSIS — E1165 Type 2 diabetes mellitus with hyperglycemia: Secondary | ICD-10-CM | POA: Diagnosis not present

## 2023-02-27 DIAGNOSIS — I272 Pulmonary hypertension, unspecified: Secondary | ICD-10-CM | POA: Diagnosis not present

## 2023-02-27 DIAGNOSIS — J479 Bronchiectasis, uncomplicated: Secondary | ICD-10-CM | POA: Diagnosis not present

## 2023-02-27 DIAGNOSIS — I5023 Acute on chronic systolic (congestive) heart failure: Secondary | ICD-10-CM | POA: Diagnosis not present

## 2023-03-01 DIAGNOSIS — I340 Nonrheumatic mitral (valve) insufficiency: Secondary | ICD-10-CM | POA: Diagnosis not present

## 2023-03-01 DIAGNOSIS — I5022 Chronic systolic (congestive) heart failure: Secondary | ICD-10-CM | POA: Diagnosis not present

## 2023-03-05 DIAGNOSIS — E1165 Type 2 diabetes mellitus with hyperglycemia: Secondary | ICD-10-CM | POA: Diagnosis not present

## 2023-03-05 DIAGNOSIS — I272 Pulmonary hypertension, unspecified: Secondary | ICD-10-CM | POA: Diagnosis not present

## 2023-03-05 DIAGNOSIS — J479 Bronchiectasis, uncomplicated: Secondary | ICD-10-CM | POA: Diagnosis not present

## 2023-03-05 DIAGNOSIS — I5023 Acute on chronic systolic (congestive) heart failure: Secondary | ICD-10-CM | POA: Diagnosis not present

## 2023-03-05 DIAGNOSIS — I110 Hypertensive heart disease with heart failure: Secondary | ICD-10-CM | POA: Diagnosis not present

## 2023-03-05 DIAGNOSIS — D696 Thrombocytopenia, unspecified: Secondary | ICD-10-CM | POA: Diagnosis not present

## 2023-03-05 DIAGNOSIS — E876 Hypokalemia: Secondary | ICD-10-CM | POA: Diagnosis not present

## 2023-03-07 DIAGNOSIS — I272 Pulmonary hypertension, unspecified: Secondary | ICD-10-CM | POA: Diagnosis not present

## 2023-03-07 DIAGNOSIS — Z95810 Presence of automatic (implantable) cardiac defibrillator: Secondary | ICD-10-CM | POA: Diagnosis not present

## 2023-03-07 DIAGNOSIS — I083 Combined rheumatic disorders of mitral, aortic and tricuspid valves: Secondary | ICD-10-CM | POA: Diagnosis not present

## 2023-03-07 DIAGNOSIS — I2510 Atherosclerotic heart disease of native coronary artery without angina pectoris: Secondary | ICD-10-CM | POA: Diagnosis not present

## 2023-03-07 DIAGNOSIS — Z9181 History of falling: Secondary | ICD-10-CM | POA: Diagnosis not present

## 2023-03-07 DIAGNOSIS — Z87440 Personal history of urinary (tract) infections: Secondary | ICD-10-CM | POA: Diagnosis not present

## 2023-03-07 DIAGNOSIS — E782 Mixed hyperlipidemia: Secondary | ICD-10-CM | POA: Diagnosis not present

## 2023-03-07 DIAGNOSIS — I255 Ischemic cardiomyopathy: Secondary | ICD-10-CM | POA: Diagnosis not present

## 2023-03-07 DIAGNOSIS — D696 Thrombocytopenia, unspecified: Secondary | ICD-10-CM | POA: Diagnosis not present

## 2023-03-07 DIAGNOSIS — Z7984 Long term (current) use of oral hypoglycemic drugs: Secondary | ICD-10-CM | POA: Diagnosis not present

## 2023-03-07 DIAGNOSIS — Z794 Long term (current) use of insulin: Secondary | ICD-10-CM | POA: Diagnosis not present

## 2023-03-07 DIAGNOSIS — J479 Bronchiectasis, uncomplicated: Secondary | ICD-10-CM | POA: Diagnosis not present

## 2023-03-07 DIAGNOSIS — I5023 Acute on chronic systolic (congestive) heart failure: Secondary | ICD-10-CM | POA: Diagnosis not present

## 2023-03-07 DIAGNOSIS — F1090 Alcohol use, unspecified, uncomplicated: Secondary | ICD-10-CM | POA: Diagnosis not present

## 2023-03-07 DIAGNOSIS — E039 Hypothyroidism, unspecified: Secondary | ICD-10-CM | POA: Diagnosis not present

## 2023-03-07 DIAGNOSIS — I110 Hypertensive heart disease with heart failure: Secondary | ICD-10-CM | POA: Diagnosis not present

## 2023-03-07 DIAGNOSIS — Z8701 Personal history of pneumonia (recurrent): Secondary | ICD-10-CM | POA: Diagnosis not present

## 2023-03-07 DIAGNOSIS — E1165 Type 2 diabetes mellitus with hyperglycemia: Secondary | ICD-10-CM | POA: Diagnosis not present

## 2023-03-07 DIAGNOSIS — Z7985 Long-term (current) use of injectable non-insulin antidiabetic drugs: Secondary | ICD-10-CM | POA: Diagnosis not present

## 2023-03-07 DIAGNOSIS — K5730 Diverticulosis of large intestine without perforation or abscess without bleeding: Secondary | ICD-10-CM | POA: Diagnosis not present

## 2023-03-07 DIAGNOSIS — D638 Anemia in other chronic diseases classified elsewhere: Secondary | ICD-10-CM | POA: Diagnosis not present

## 2023-03-07 DIAGNOSIS — I252 Old myocardial infarction: Secondary | ICD-10-CM | POA: Diagnosis not present

## 2023-03-07 DIAGNOSIS — Z792 Long term (current) use of antibiotics: Secondary | ICD-10-CM | POA: Diagnosis not present

## 2023-03-07 DIAGNOSIS — Z7951 Long term (current) use of inhaled steroids: Secondary | ICD-10-CM | POA: Diagnosis not present

## 2023-03-07 DIAGNOSIS — Z87891 Personal history of nicotine dependence: Secondary | ICD-10-CM | POA: Diagnosis not present

## 2023-03-13 DIAGNOSIS — I081 Rheumatic disorders of both mitral and tricuspid valves: Secondary | ICD-10-CM | POA: Diagnosis not present

## 2023-03-13 DIAGNOSIS — I340 Nonrheumatic mitral (valve) insufficiency: Secondary | ICD-10-CM | POA: Diagnosis not present

## 2023-03-13 DIAGNOSIS — I429 Cardiomyopathy, unspecified: Secondary | ICD-10-CM | POA: Diagnosis not present

## 2023-03-13 DIAGNOSIS — I272 Pulmonary hypertension, unspecified: Secondary | ICD-10-CM | POA: Diagnosis not present

## 2023-03-14 DIAGNOSIS — I272 Pulmonary hypertension, unspecified: Secondary | ICD-10-CM | POA: Diagnosis not present

## 2023-03-14 DIAGNOSIS — I5023 Acute on chronic systolic (congestive) heart failure: Secondary | ICD-10-CM | POA: Diagnosis not present

## 2023-03-14 DIAGNOSIS — D696 Thrombocytopenia, unspecified: Secondary | ICD-10-CM | POA: Diagnosis not present

## 2023-03-14 DIAGNOSIS — E1165 Type 2 diabetes mellitus with hyperglycemia: Secondary | ICD-10-CM | POA: Diagnosis not present

## 2023-03-14 DIAGNOSIS — J479 Bronchiectasis, uncomplicated: Secondary | ICD-10-CM | POA: Diagnosis not present

## 2023-03-14 DIAGNOSIS — I110 Hypertensive heart disease with heart failure: Secondary | ICD-10-CM | POA: Diagnosis not present

## 2023-03-16 DIAGNOSIS — G4733 Obstructive sleep apnea (adult) (pediatric): Secondary | ICD-10-CM | POA: Diagnosis not present

## 2023-03-19 DIAGNOSIS — I5023 Acute on chronic systolic (congestive) heart failure: Secondary | ICD-10-CM | POA: Diagnosis not present

## 2023-03-19 DIAGNOSIS — D696 Thrombocytopenia, unspecified: Secondary | ICD-10-CM | POA: Diagnosis not present

## 2023-03-19 DIAGNOSIS — J479 Bronchiectasis, uncomplicated: Secondary | ICD-10-CM | POA: Diagnosis not present

## 2023-03-19 DIAGNOSIS — I110 Hypertensive heart disease with heart failure: Secondary | ICD-10-CM | POA: Diagnosis not present

## 2023-03-19 DIAGNOSIS — I272 Pulmonary hypertension, unspecified: Secondary | ICD-10-CM | POA: Diagnosis not present

## 2023-03-19 DIAGNOSIS — E1165 Type 2 diabetes mellitus with hyperglycemia: Secondary | ICD-10-CM | POA: Diagnosis not present

## 2023-03-20 DIAGNOSIS — J4530 Mild persistent asthma, uncomplicated: Secondary | ICD-10-CM | POA: Diagnosis not present

## 2023-03-20 DIAGNOSIS — I38 Endocarditis, valve unspecified: Secondary | ICD-10-CM | POA: Diagnosis not present

## 2023-03-20 DIAGNOSIS — G4733 Obstructive sleep apnea (adult) (pediatric): Secondary | ICD-10-CM | POA: Diagnosis not present

## 2023-03-25 DIAGNOSIS — I110 Hypertensive heart disease with heart failure: Secondary | ICD-10-CM | POA: Diagnosis not present

## 2023-03-25 DIAGNOSIS — I5023 Acute on chronic systolic (congestive) heart failure: Secondary | ICD-10-CM | POA: Diagnosis not present

## 2023-03-25 DIAGNOSIS — I272 Pulmonary hypertension, unspecified: Secondary | ICD-10-CM | POA: Diagnosis not present

## 2023-03-25 DIAGNOSIS — J479 Bronchiectasis, uncomplicated: Secondary | ICD-10-CM | POA: Diagnosis not present

## 2023-03-25 DIAGNOSIS — D696 Thrombocytopenia, unspecified: Secondary | ICD-10-CM | POA: Diagnosis not present

## 2023-03-25 DIAGNOSIS — E1165 Type 2 diabetes mellitus with hyperglycemia: Secondary | ICD-10-CM | POA: Diagnosis not present

## 2023-04-01 DIAGNOSIS — I5023 Acute on chronic systolic (congestive) heart failure: Secondary | ICD-10-CM | POA: Diagnosis not present

## 2023-04-01 DIAGNOSIS — I272 Pulmonary hypertension, unspecified: Secondary | ICD-10-CM | POA: Diagnosis not present

## 2023-04-01 DIAGNOSIS — J479 Bronchiectasis, uncomplicated: Secondary | ICD-10-CM | POA: Diagnosis not present

## 2023-04-01 DIAGNOSIS — E1165 Type 2 diabetes mellitus with hyperglycemia: Secondary | ICD-10-CM | POA: Diagnosis not present

## 2023-04-01 DIAGNOSIS — I110 Hypertensive heart disease with heart failure: Secondary | ICD-10-CM | POA: Diagnosis not present

## 2023-04-01 DIAGNOSIS — D696 Thrombocytopenia, unspecified: Secondary | ICD-10-CM | POA: Diagnosis not present

## 2023-04-06 ENCOUNTER — Encounter: Payer: Self-pay | Admitting: Internal Medicine

## 2023-04-08 ENCOUNTER — Telehealth: Payer: Medicare Other | Admitting: Internal Medicine

## 2023-04-09 DIAGNOSIS — I5020 Unspecified systolic (congestive) heart failure: Secondary | ICD-10-CM | POA: Diagnosis not present

## 2023-04-17 ENCOUNTER — Telehealth: Payer: Medicare Other | Admitting: Internal Medicine

## 2023-05-15 ENCOUNTER — Ambulatory Visit: Payer: Medicare Other | Admitting: Internal Medicine

## 2023-05-20 ENCOUNTER — Ambulatory Visit (INDEPENDENT_AMBULATORY_CARE_PROVIDER_SITE_OTHER): Payer: Medicare Other | Admitting: Internal Medicine

## 2023-05-20 ENCOUNTER — Other Ambulatory Visit: Payer: Self-pay

## 2023-05-20 ENCOUNTER — Encounter: Payer: Self-pay | Admitting: Internal Medicine

## 2023-05-20 VITALS — BP 120/74 | HR 81 | Temp 97.4°F | Ht 66.0 in | Wt 144.0 lb

## 2023-05-20 DIAGNOSIS — B9561 Methicillin susceptible Staphylococcus aureus infection as the cause of diseases classified elsewhere: Secondary | ICD-10-CM | POA: Diagnosis present

## 2023-05-20 DIAGNOSIS — R7881 Bacteremia: Secondary | ICD-10-CM | POA: Diagnosis not present

## 2023-05-20 DIAGNOSIS — T827XXD Infection and inflammatory reaction due to other cardiac and vascular devices, implants and grafts, subsequent encounter: Secondary | ICD-10-CM

## 2023-05-20 NOTE — Patient Instructions (Signed)
 Make sure you continue cefadroxil  500 mg twice a day; let me know at least 2 weeks out if you run out   I'll review labs from [facility] each visit so we don't need to do labs now   If your blood count significantly drop, we might have to think about contribution from the cefadroxil  too. For now it's stable   6 months visit with me  thanks

## 2023-05-20 NOTE — Progress Notes (Signed)
 [facility]  Cc- f/u mssa bacteremia    Patient Active Problem List   Diagnosis Date Noted   Sepsis (HCC) 01/03/2023   Bacteremia 12/11/2022   Thyroid  disease 10/10/2022   Diabetes mellitus (HCC) 10/10/2022   Acute on chronic combined systolic and diastolic heart failure (HCC) 08/22/2022   Hoarse 05/23/2022   Fatigue 11/02/2021   Anemia of chronic disease 12/16/2020   Bilateral impacted cerumen 02/08/2020   H/O heart artery stent 01/21/2018   Chronic ITP (idiopathic thrombocytopenia) (HCC) 07/10/2017   Thrombocytopenia (HCC) 07/10/2017   Presence of automatic (implantable) cardiac defibrillator 04/15/2017   CAD in native artery 01/29/2017   Dyslipidemia 01/29/2017   Old MI (myocardial infarction) 01/29/2017   Acquired hypothyroidism 03/14/2015   Exertional chest pain 03/14/2015   Atypical chest pain 03/14/2015   Encounter for screening for diabetes mellitus 09/20/2014   Essential hypertension 08/30/2014   Type 2 diabetes mellitus without complications (HCC) 08/30/2014      HPI: Randall Moses is a 89 y.o. male with ICM (ef 25%), chronic severe mr and tr, aaa s/p stent, copd, dm2, s/p aicd, chronic thrombocytopenia, referred here from primary care for management of presume IE/mssa bacteremia  I reviewed primary care chart and asked/received [name] recent admission. Appears he was dx'ed with mssa bacteremia on 11/28/2022 -- I do not have actual result. Tte done but tee not done as he didn't want; no mention in chart of aicd lead involvement. I am not sure if he was seen by id/cardiology   He also had several chf exacerbation visits and the last one seemed to be on 9/23 needing bipap for hypoxemic resp failure in setting volume overload. Volume controlled by discharge with iv furosemide   01/02/23 labs; cr 0.8; cbc 8.10/19/35 (chronic thrombocytopenia).  Cards had recommended mitral clip for him and he'll continue f/u outpatient  cardiology; next week visit and tee is pending. Aicd removal/replacement was discussed but it was decided he wasn't well enough to have it replaced  He is on cefazolin  2 gram every 8 hours; there is no issue with his right upper ext picc.  His daughter in law is here today and reported that he didn't see ID consultation there. She reports his platelet is in 60 yesterday. I reviewed labs on the phone from his daughter -- cbc cr 0.9; lft normal; cbc 5/10.7/69  He didn't remember when he was first dx'ed with endocarditis that he had any joint pain/back pain and still doesn't have now  He is tolerating cefazolin  (no n/v/d/rash)  His appetite is normal now   -------------- 05/20/23 id clinic f/u See a&P for detail    Review of Systems: ROS All other ros negative      Past Medical History:  Diagnosis Date   Allergy    CHF (congestive heart failure) (HCC)    Diabetes mellitus without complication (HCC)    See above  Social History   Tobacco Use   Smoking status: Former    Types: Cigarettes   Smokeless tobacco: Never  Substance Use Topics   Alcohol use: Not Currently   Drug use: Not Currently    No family history on file. Cva/cad parents Father with some kind of cancer  Social hx: Hx distant smoker Prior [employer]) Lives with his wife  Son/daughter in law currently helping  OBJECTIVE: Vitals:   05/20/23 1057  BP: 120/74  Pulse: 81  Temp: (!) 97.4 F (36.3 C)  TempSrc: Oral  Weight: 144 lb (65.3 kg)  Height: 5\' 6"  (1.676 m)     There is no height or weight on file to calculate BMI.   Physical Exam General/constitutional: no distress, pleasant HEENT: Normocephalic, PER, Conj Clear, EOMI, Oropharynx clear Neck supple CV: rrr no mrg; left chest icd site no tenderness/redness/fluctuance Lungs: clear to auscultation, normal respiratory effort Abd: Soft, Nontender Ext: no edema Skin: No Rash Neuro: nonfocal MSK: no peripheral  joint swelling/tenderness/warmth; back spines nontender   Lab: Reviewed care everywhere labs --> see a&p    Microbiology:  Serology:  Imaging:   Assessment/plan: Problem List Items Addressed This Visit   None    89 yo male with ischemic chf, chronic itp, DM2, aaa prior stent placement, with below ID problems:  Hx scarlet fever and presumed tv/mv rheumatic heart disease Presumed IE/icd infection Onset 11/28/22  On 6 weeks cefazolin  --> 01/16/23 transitioned to cefadroxil  1000 mg po bid  Not a candidate per previous cardiology discussion for aicd replacement  Mitral valve regurg pending further cardiology evaluation for consideration mitral valve repair  After 3-6 months potentially we can decrease the cefadroxil  dose to 500 mg twice a day -- will be ongoing discussion   F/ cardiology  F/u with me in 4 weeks   ------------ 02/14/23 id assessment He had [place] hospital admission recently for chf exacerbation He saw cardiology at [facility] 02/13/23 for chf management and I reviewed their note from care every where No tee yet He'll see advance heart failure today  He is tolerating cefadroxil  but he is only taking 500 mg twice a day No fever, chill, n/v/diarrhea No focal joint/back pain  Will await advance heart failure evaluation. If the device was to remain in then will keep on cefadorxil 500 mg twice a day indefinitely  At this time doesn't appear to have any breakthrough infection so keeping on 500 mg po bid of cefadroxil   Labs reviewed from care every where and no need to do today  F/u 6-8 weeks video visit  ------------------- 05/20/23 id clinic assessment Patient continues on cefadroxil  500 mg po bid Careeverywhere labs: 04/16/23 cbc 4.08/18/28 03/13/23 cbc 4.09/18/32  Of note, patient has chronic ITP and platelet has been stable in the 30s. Sees oncology at [facility]; bone marrow 06/2022 trilineage hematopoiesis no increase blast. Worry mds also  present with relative pancytopenia as well  Cardiology tte 03/13/23 as below. No mention of the cardiac device SUMMARY Some reverse remodelling of chronic severe cardiomyopathy with reduced sphericity index, smaller LV volume, slight improvement in LVEF and reduced LAP compared with prior. The left ventricle is severely dilated with normal left ventricular wall thickness. There is mild eccentric left ventricular hypertrophy with severe global hypokinesis and severely reduced systolic function. LVEF  20-25 % . Moderate diastolic dysfunction [pseudonormal pattern] with elevated left atrial pressure. The right ventricle is normal in size and function. The left atrium is severely dilated. There is mild to moderate mitral regurgitation. There is mild tricuspid regurgitation. Borderline-Mild pulmonary hypertension. Estimated right ventricular systolic pressure is 35 mmHg. The IVC is normal in size with an inspiratory collapse of greater than 50%, suggesting normal right atrial pressure. The aortic sinus is normal size.   Per 02/2023 cardiology mention --> no plan for tee; if recurrent mssa bacteremia then device removal would be needed   Doesn't seem cefadroxil  is causing any more than baseline bone marrow suppression in him at this time    Follow-up: Return in about  6 months (around 11/17/2023).  Jamesetta Mcbride, MD [facility] 05/20/2023, 10:55 AM

## 2023-05-22 ENCOUNTER — Telehealth (HOSPITAL_COMMUNITY): Payer: Self-pay | Admitting: Cardiology

## 2023-05-22 ENCOUNTER — Encounter (HOSPITAL_COMMUNITY): Payer: Self-pay

## 2023-05-22 NOTE — Telephone Encounter (Signed)
Called patient at [phone] to schedule this patient a follow up appointment with Dr. Elwyn Lade in the [facility].   Front office left patient a voicemail on his telephone to call Dr. Haze Rushing office back to make a 3 mo f/u appt in the clinic.

## 2023-07-24 ENCOUNTER — Encounter: Payer: Self-pay | Admitting: Internal Medicine

## 2023-07-24 ENCOUNTER — Other Ambulatory Visit: Payer: Self-pay

## 2023-07-24 MED ORDER — CEFADROXIL 500 MG PO CAPS: 1000.0000 mg | ORAL_CAPSULE | Freq: Two times a day (BID) | ORAL | 5 refills | Status: DC

## 2023-11-07 ENCOUNTER — Other Ambulatory Visit: Payer: Self-pay | Admitting: Internal Medicine

## 2023-11-13 ENCOUNTER — Ambulatory Visit: Payer: Medicare Other | Admitting: Internal Medicine

## 2023-11-13 ENCOUNTER — Other Ambulatory Visit: Payer: Self-pay

## 2023-11-13 ENCOUNTER — Encounter: Payer: Self-pay | Admitting: Internal Medicine

## 2023-11-13 VITALS — BP 127/73 | HR 88 | Temp 97.4°F | Wt 149.2 lb

## 2023-11-13 DIAGNOSIS — D693 Immune thrombocytopenic purpura: Secondary | ICD-10-CM

## 2023-11-13 DIAGNOSIS — T827XXD Infection and inflammatory reaction due to other cardiac and vascular devices, implants and grafts, subsequent encounter: Secondary | ICD-10-CM | POA: Diagnosis not present

## 2023-11-13 NOTE — Patient Instructions (Signed)
 Please continue cefadroxil  500 mg twice a day   See me in 6 months for routine follow up   At this time I think you are on cruise control

## 2023-11-13 NOTE — Progress Notes (Signed)
 [facility]  Cc- f/u mssa bacteremia    Patient Active Problem List   Diagnosis Date Noted   Sepsis (HCC) 01/03/2023   Bacteremia 12/11/2022   Thyroid  disease 10/10/2022   Diabetes mellitus (HCC) 10/10/2022   Acute on chronic combined systolic and diastolic heart failure (HCC) 08/22/2022   Hoarse 05/23/2022   Fatigue 11/02/2021   Anemia of chronic disease 12/16/2020   Bilateral impacted cerumen 02/08/2020   H/O heart artery stent 01/21/2018   Chronic ITP (idiopathic thrombocytopenia) (HCC) 07/10/2017   Thrombocytopenia (HCC) 07/10/2017   Presence of automatic (implantable) cardiac defibrillator 04/15/2017   CAD in native artery 01/29/2017   Dyslipidemia 01/29/2017   Old MI (myocardial infarction) 01/29/2017   Acquired hypothyroidism 03/14/2015   Exertional chest pain 03/14/2015   Atypical chest pain 03/14/2015   Encounter for screening for diabetes mellitus 09/20/2014   Essential hypertension 08/30/2014   Type 2 diabetes mellitus without complications (HCC) 08/30/2014      HPI: Randall Moses is a 89 y.o. male with ICM (ef 25%), chronic severe mr and tr, aaa s/p stent, copd, dm2, s/p aicd, chronic thrombocytopenia, referred here from primary care for management of presume IE/mssa bacteremia  I reviewed primary care chart and asked/received [facility] recent admission. Appears he was dx'ed with mssa bacteremia on 11/28/2022 -- I do not have actual result. Tte done but tee not done as he didn't want; no mention in chart of aicd lead involvement. I am not sure if he was seen by id/cardiology   He also had several chf exacerbation visits and the last one seemed to be on 9/23 needing bipap for hypoxemic resp failure in setting volume overload. Volume controlled by discharge with iv furosemide   01/02/23 labs; cr 0.8; cbc 8.10/19/35 (chronic thrombocytopenia).  Cards had recommended mitral clip for him and he'll continue f/u outpatient  cardiology; next week visit and tee is pending. Aicd removal/replacement was discussed but it was decided he wasn't well enough to have it replaced  He is on cefazolin  2 gram every 8 hours; there is no issue with his right upper ext picc.  His daughter in law is here today and reported that he didn't see ID consultation there. She reports his platelet is in 60 yesterday. I reviewed labs on the phone from his daughter -- cbc cr 0.9; lft normal; cbc 5/10.7/69  He didn't remember when he was first dx'ed with endocarditis that he had any joint pain/back pain and still doesn't have now  He is tolerating cefazolin  (no n/v/d/rash)  His appetite is normal now   -------------- 11/13/23 id clinic f/u See a&P for detail    Review of Systems: ROS All other ros negative      Past Medical History:  Diagnosis Date   Allergy    CHF (congestive heart failure) (HCC)    Diabetes mellitus without complication (HCC)    See above  Social History   Tobacco Use   Smoking status: Former    Types: Cigarettes   Smokeless tobacco: Never  Substance Use Topics   Alcohol use: Not Currently   Drug use: Not Currently    No family history on file. Cva/cad parents Father with some kind of cancer  Social hx: Hx distant smoker Prior [employer]) Lives with his wife  Son/daughter in law currently helping  OBJECTIVE: Vitals:   11/13/23 1046  Weight: 149 lb 3.2 oz (67.7 kg)     Body mass  index is 24.08 kg/m.   Physical Exam General/constitutional: no distress, pleasant HEENT: Normocephalic, PER, Conj Clear, EOMI, Oropharynx clear Neck supple CV: rrr no mrg; left chest icd site no tenderness/redness/fluctuance Lungs: clear to auscultation, normal respiratory effort Abd: Soft, Nontender Ext: no edema Skin: No Rash Neuro: nonfocal MSK: no peripheral joint swelling/tenderness/warmth; back spines nontender   Lab: Reviewed care everywhere labs --> see  a&p    Microbiology:  Serology:  Imaging:   Assessment/plan: Problem List Items Addressed This Visit     Chronic ITP (idiopathic thrombocytopenia) (HCC)   Other Visit Diagnoses       Infection and inflammatory reaction due to cardiac device, implant, and graft, subsequent encounter    -  Primary        89 yo male with ischemic chf, chronic itp, DM2, aaa prior stent placement, with below ID problems:  Hx scarlet fever and presumed tv/mv rheumatic heart disease Presumed IE/icd infection Onset 11/28/22  On 6 weeks cefazolin  --> 01/16/23 transitioned to cefadroxil  1000 mg po bid  Not a candidate per previous cardiology discussion for aicd replacement  Mitral valve regurg pending further cardiology evaluation for consideration mitral valve repair  After 3-6 months potentially we can decrease the cefadroxil  dose to 500 mg twice a day -- will be ongoing discussion   F/ cardiology  F/u with me in 4 weeks   ------------ 02/14/23 id assessment He had [place] hospital admission recently for chf exacerbation He saw cardiology at [facility] 02/13/23 for chf management and I reviewed their note from care every where No tee yet He'll see advance heart failure today  He is tolerating cefadroxil  but he is only taking 500 mg twice a day No fever, chill, n/v/diarrhea No focal joint/back pain  Will await advance heart failure evaluation. If the device was to remain in then will keep on cefadorxil 500 mg twice a day indefinitely  At this time doesn't appear to have any breakthrough infection so keeping on 500 mg po bid of cefadroxil   Labs reviewed from care every where and no need to do today  F/u 6-8 weeks video visit  ------------------- 05/20/23 id clinic assessment Patient continues on cefadroxil  500 mg po bid Careeverywhere labs: 04/16/23 cbc 4.08/18/28 03/13/23 cbc 4.09/18/32  Of note, patient has chronic ITP and platelet has been stable in the 30s. Sees oncology at  [facility]; bone marrow 06/2022 trilineage hematopoiesis no increase blast. Worry mds also present with relative pancytopenia as well  Cardiology tte 03/13/23 as below. No mention of the cardiac device SUMMARY Some reverse remodelling of chronic severe cardiomyopathy with reduced sphericity index, smaller LV volume, slight improvement in LVEF and reduced LAP compared with prior. The left ventricle is severely dilated with normal left ventricular wall thickness. There is mild eccentric left ventricular hypertrophy with severe global hypokinesis and severely reduced systolic function. LVEF  20-25 % . Moderate diastolic dysfunction [pseudonormal pattern] with elevated left atrial pressure. The right ventricle is normal in size and function. The left atrium is severely dilated. There is mild to moderate mitral regurgitation. There is mild tricuspid regurgitation. Borderline-Mild pulmonary hypertension. Estimated right ventricular systolic pressure is 35 mmHg. The IVC is normal in size with an inspiratory collapse of greater than 50%, suggesting normal right atrial pressure. The aortic sinus is normal size.   Per 02/2023 cardiology mention --> no plan for tee; if recurrent mssa bacteremia then device removal would be needed   Doesn't seem cefadroxil  is causing any more than baseline  bone marrow suppression in him at this time   ---------- 11/13/23 id clinic assessment He follows up today on cefadroxil  suppression mssa infection in setting aicd for his ischemic cardiomyopathy He was started on steroid for itp; platelet 30s-->50s as of 09/2023. He takes dexamethasone once a week. He has been on this since 05/2023 Seeing cardiology in follow up soon; pending repeat tte; no tee planned; no surgery planned He feels well at baseline health Tolerating cefadroxil  500 twice a day   He had labs in 09/2023 so I won't get labs here        Follow-up: Return in about 6 months (around  05/15/2024).  Randall ONEIDA Passer, MD [facility] 11/13/2023, 10:49 AM

## 2024-01-29 ENCOUNTER — Other Ambulatory Visit: Payer: Self-pay | Admitting: Internal Medicine

## 2024-01-29 NOTE — Telephone Encounter (Signed)
 Please advise regarding dose: Rx is for 1,000 mg BID, last note states 500 mg BID

## 2024-01-30 NOTE — Telephone Encounter (Signed)
 Per last office note on suppression and sent in prescription for Cefadroxil  500 mg PO bid.
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
## 2024-04-23 NOTE — Progress Notes (Signed)
 Medication refilled and sent to mail order pharmacy as requested.

## 2024-04-23 NOTE — Progress Notes (Signed)
Medications refilled and sent to mail-order pharmacy

## 2024-05-14 ENCOUNTER — Other Ambulatory Visit: Payer: Self-pay | Admitting: Family

## 2024-05-14 NOTE — Telephone Encounter (Signed)
 Appt 2/6

## 2024-05-15 ENCOUNTER — Other Ambulatory Visit: Payer: Self-pay

## 2024-05-15 ENCOUNTER — Encounter: Payer: Self-pay | Admitting: Internal Medicine

## 2024-05-15 ENCOUNTER — Ambulatory Visit: Admitting: Internal Medicine

## 2024-05-15 VITALS — BP 112/78 | HR 73 | Temp 97.7°F | Wt 157.0 lb

## 2024-05-15 DIAGNOSIS — Z79899 Other long term (current) drug therapy: Secondary | ICD-10-CM

## 2024-05-15 DIAGNOSIS — R7881 Bacteremia: Secondary | ICD-10-CM

## 2024-05-15 DIAGNOSIS — R7989 Other specified abnormal findings of blood chemistry: Secondary | ICD-10-CM

## 2024-05-15 DIAGNOSIS — Z95810 Presence of automatic (implantable) cardiac defibrillator: Secondary | ICD-10-CM

## 2024-05-15 LAB — COMPLETE METABOLIC PANEL WITHOUT GFR
AG Ratio: 1.9 (calc) (ref 1.0–2.5)
ALT: 29 U/L (ref 9–46)
AST: 23 U/L (ref 10–35)
Albumin: 4.3 g/dL (ref 3.6–5.1)
Alkaline phosphatase (APISO): 53 U/L (ref 35–144)
BUN/Creatinine Ratio: 34 (calc) — ABNORMAL HIGH (ref 6–22)
BUN: 44 mg/dL — ABNORMAL HIGH (ref 7–25)
CO2: 28 mmol/L (ref 20–32)
Calcium: 9.4 mg/dL (ref 8.6–10.3)
Chloride: 105 mmol/L (ref 98–110)
Creat: 1.3 mg/dL — ABNORMAL HIGH (ref 0.70–1.22)
Globulin: 2.3 g/dL (ref 1.9–3.7)
Glucose, Bld: 185 mg/dL — ABNORMAL HIGH (ref 65–99)
Potassium: 4.5 mmol/L (ref 3.5–5.3)
Sodium: 140 mmol/L (ref 135–146)
Total Bilirubin: 0.5 mg/dL (ref 0.2–1.2)
Total Protein: 6.6 g/dL (ref 6.1–8.1)

## 2024-05-15 MED ORDER — CEFADROXIL 500 MG PO CAPS: 500.0000 mg | ORAL_CAPSULE | Freq: Two times a day (BID) | ORAL | 11 refills | Status: AC

## 2024-05-15 NOTE — Patient Instructions (Addendum)
 Blood test for kidney/liver function today   Your kidney function significantly was worse in 12/2023 compared to in 2024  If it continues to be worse I'll notify you and get you to see  a kidney doctor  If it is stable/improving I'll defer to your primary care for monitoring   Continue cefadroxil  -- rx renewal placed   See us  otherwise 03/2025

## 2024-05-15 NOTE — Progress Notes (Signed)
 "       [facility]  Cc- f/u mssa bacteremia    Patient Active Problem List   Diagnosis Date Noted   Sepsis (HCC) 01/03/2023   Bacteremia 12/11/2022   Thyroid  disease 10/10/2022   Diabetes mellitus (HCC) 10/10/2022   Acute on chronic combined systolic and diastolic heart failure (HCC) 08/22/2022   Hoarse 05/23/2022   Fatigue 11/02/2021   Anemia of chronic disease 12/16/2020   Bilateral impacted cerumen 02/08/2020   H/O heart artery stent 01/21/2018   Chronic ITP (idiopathic thrombocytopenia) (HCC) 07/10/2017   Thrombocytopenia 07/10/2017   Presence of automatic (implantable) cardiac defibrillator 04/15/2017   CAD in native artery 01/29/2017   Dyslipidemia 01/29/2017   Old MI (myocardial infarction) 01/29/2017   Acquired hypothyroidism 03/14/2015   Exertional chest pain 03/14/2015   Atypical chest pain 03/14/2015   Encounter for screening for diabetes mellitus 09/20/2014   Essential hypertension 08/30/2014   Type 2 diabetes mellitus without complications (HCC) 08/30/2014      HPI: Randall Moses is a 89 y.o. male with ICM (ef 25%), chronic severe mr and tr, aaa s/p stent, copd, dm2, s/p aicd, chronic thrombocytopenia, referred here from primary care for management of presume IE/mssa bacteremia  I reviewed primary care chart and asked/received [facility] recent admission. Appears he was dx'ed with mssa bacteremia on 11/28/2022 -- I do not have actual result. Tte done but tee not done as he didn't want; no mention in chart of aicd lead involvement. I am not sure if he was seen by id/cardiology   He also had several chf exacerbation visits and the last one seemed to be on 9/23 needing bipap for hypoxemic resp failure in setting volume overload. Volume controlled by discharge with iv furosemide   01/02/23 labs; cr 0.8; cbc 8.10/19/35 (chronic thrombocytopenia).  Cards had recommended mitral clip for him and he'll continue f/u outpatient  cardiology; next week visit and tee is pending. Aicd removal/replacement was discussed but it was decided he wasn't well enough to have it replaced  He is on cefazolin  2 gram every 8 hours; there is no issue with his right upper ext picc.  His daughter in law is here today and reported that he didn't see ID consultation there. She reports his platelet is in 60 yesterday. I reviewed labs on the phone from his daughter -- cbc cr 0.9; lft normal; cbc 5/10.7/69  He didn't remember when he was first dx'ed with endocarditis that he had any joint pain/back pain and still doesn't have now  He is tolerating cefazolin  (no n/v/d/rash)  His appetite is normal now   -------------- 05/15/24 id clinic f/u See a&P for detail No complaint    Review of Systems: ROS All other ros negative      Past Medical History:  Diagnosis Date   Allergy    CHF (congestive heart failure) (HCC)    Diabetes mellitus without complication (HCC)    See above  Social History   Tobacco Use   Smoking status: Former    Types: Cigarettes   Smokeless tobacco: Never  Substance Use Topics   Alcohol use: Not Currently   Drug use: Not Currently    No family history on file. Cva/cad parents Father with some kind of cancer  Social hx: Hx distant smoker Prior [employer]) Lives with his wife  Son/daughter in law currently helping  OBJECTIVE: Vitals:   05/15/24 1030 05/15/24 1032  BP:  112/78  Pulse:  73  Temp:  97.7 F (36.5 C)  TempSrc:  Oral  SpO2:  97%  Weight: 157 lb (71.2 kg)       Body mass index is 25.34 kg/m.   Physical Exam General/constitutional: no distress, pleasant HEENT: Normocephalic, PER, Conj Clear, EOMI, Oropharynx clear Neck supple CV: rrr no mrg; left chest icd site no tenderness/redness/fluctuance Lungs: clear to auscultation, normal respiratory effort Abd: Soft, Nontender Ext: no edema Skin: No Rash Neuro: nonfocal MSK: no peripheral joint  swelling/tenderness/warmth; back spines nontender   Lab: Reviewed care every where labs  No results found for: WBC, HGB, HCT, MCV, PLT Last metabolic panel No results found for: GLUCOSE, NA, K, CL, CO2, BUN, CREATININE, EGFR, CALCIUM, PHOS, PROT, ALBUMIN, LABGLOB, AGRATIO, BILITOT, ALKPHOS, AST, ALT, ANIONGAP    Microbiology:  Serology:  Imaging:   Assessment/plan: Problem List Items Addressed This Visit       Other   Presence of automatic (implantable) cardiac defibrillator - Primary   Relevant Orders   COMPLETE METABOLIC PANEL WITHOUT GFR   Other Visit Diagnoses       MSSA bacteremia       Relevant Orders   COMPLETE METABOLIC PANEL WITHOUT GFR     Elevated serum creatinine         Long-term use of high-risk medication       Relevant Orders   COMPLETE METABOLIC PANEL WITHOUT GFR         89 yo male with ischemic chf, chronic itp, DM2, aaa prior stent placement, with below ID problems:  Hx scarlet fever and presumed tv/mv rheumatic heart disease Presumed IE/icd infection Onset 11/28/22  On 6 weeks cefazolin  --> 01/16/23 transitioned to cefadroxil  1000 mg po bid  Not a candidate per previous cardiology discussion for aicd replacement  Mitral valve regurg pending further cardiology evaluation for consideration mitral valve repair  After 3-6 months potentially we can decrease the cefadroxil  dose to 500 mg twice a day -- will be ongoing discussion   F/ cardiology  F/u with me in 4 weeks   ------------ 02/14/23 id assessment He had [facility] admission recently for chf exacerbation He saw cardiology at [facility] 02/13/23 for chf management and I reviewed their note from care every where No tee yet He'll see advance heart failure today  He is tolerating cefadroxil  but he is only taking 500 mg twice a day No fever, chill, n/v/diarrhea No focal joint/back pain  Will await advance heart failure  evaluation. If the device was to remain in then will keep on cefadorxil 500 mg twice a day indefinitely  At this time doesn't appear to have any breakthrough infection so keeping on 500 mg po bid of cefadroxil   Labs reviewed from care every where and no need to do today  F/u 6-8 weeks video visit  ------------------- 05/20/23 id clinic assessment Patient continues on cefadroxil  500 mg po bid Careeverywhere labs: 04/16/23 cbc 4.08/18/28 03/13/23 cbc 4.09/18/32  Of note, patient has chronic ITP and platelet has been stable in the 30s. Sees oncology at [facility]; bone marrow 06/2022 trilineage hematopoiesis no increase blast. Worry mds also present with relative pancytopenia as well  Cardiology tte 03/13/23 as below. No mention of the cardiac device SUMMARY Some reverse remodelling of chronic severe cardiomyopathy with reduced sphericity index, smaller LV volume, slight improvement in LVEF and reduced LAP compared with prior. The left ventricle is severely dilated with normal left ventricular wall thickness. There is mild eccentric left ventricular hypertrophy with severe global hypokinesis and severely  reduced systolic function. LVEF  20-25 % . Moderate diastolic dysfunction [pseudonormal pattern] with elevated left atrial pressure. The right ventricle is normal in size and function. The left atrium is severely dilated. There is mild to moderate mitral regurgitation. There is mild tricuspid regurgitation. Borderline-Mild pulmonary hypertension. Estimated right ventricular systolic pressure is 35 mmHg. The IVC is normal in size with an inspiratory collapse of greater than 50%, suggesting normal right atrial pressure. The aortic sinus is normal size.   Per 02/2023 cardiology mention --> no plan for tee; if recurrent mssa bacteremia then device removal would be needed   Doesn't seem cefadroxil  is causing any more than baseline bone marrow suppression in him at this  time   ---------- 11/13/23 id clinic assessment He follows up today on cefadroxil  suppression mssa infection in setting aicd for his ischemic cardiomyopathy He was started on steroid for itp; platelet 30s-->50s as of 09/2023. He takes dexamethasone once a week. He has been on this since 05/2023 Seeing cardiology in follow up soon; pending repeat tte; no tee planned; no surgery planned He feels well at baseline health Tolerating cefadroxil  500 twice a day   He had labs in 09/2023 so I won't get labs here    ----------- 05/15/24 id clinic assessment Patient here for f/u mssa bsi in setting aicd, on chronic cefadroxil  suppression, currently at 500 mg twice daily No complaint  Labs reivewed @[facility] everywhere 12/2023 cr 1.5; I n2024 was 0.9 05/13/24 platelet 40s stable  On lasix  for lower ext edema   Cr elevation rather significant. ?prerenal vs abx side effect (latter less likely) vs other intrarenal  Will recheck creatinine -- if continue to rise will notify patient to see renal, if stable/improving can routinely follow cards to perhaps monitor lasix  dosing and see if need to adjust lasix  dosing   Continue cefadroxil  500 mg po bid          Follow-up: Return in about 10 months (around 03/14/2025).  Constance ONEIDA Passer, MD [facility] 05/15/2024, 10:29 AM  "

## 2025-03-23 ENCOUNTER — Ambulatory Visit: Payer: Self-pay | Admitting: Internal Medicine
